# Patient Record
Sex: Male | Born: 1935 | Race: White | Hispanic: No | Smoking: Former smoker
Health system: Southern US, Community
[De-identification: ages and names within clinical notes are randomized; demographics above are authoritative.]

## PROBLEM LIST (undated history)

## (undated) DIAGNOSIS — K769 Liver disease, unspecified: Secondary | ICD-10-CM

## (undated) DIAGNOSIS — K219 Gastro-esophageal reflux disease without esophagitis: Secondary | ICD-10-CM

## (undated) DIAGNOSIS — I10 Essential (primary) hypertension: Secondary | ICD-10-CM

## (undated) DIAGNOSIS — R490 Dysphonia: Secondary | ICD-10-CM

## (undated) DIAGNOSIS — R51 Headache: Secondary | ICD-10-CM

## (undated) DIAGNOSIS — I8290 Acute embolism and thrombosis of unspecified vein: Secondary | ICD-10-CM

## (undated) DIAGNOSIS — I714 Abdominal aortic aneurysm, without rupture: Secondary | ICD-10-CM

## (undated) DIAGNOSIS — N189 Chronic kidney disease, unspecified: Secondary | ICD-10-CM

## (undated) DIAGNOSIS — R0602 Shortness of breath: Secondary | ICD-10-CM

## (undated) DIAGNOSIS — I2510 Atherosclerotic heart disease of native coronary artery without angina pectoris: Secondary | ICD-10-CM

## (undated) DIAGNOSIS — M1990 Unspecified osteoarthritis, unspecified site: Secondary | ICD-10-CM

## (undated) DIAGNOSIS — I219 Acute myocardial infarction, unspecified: Secondary | ICD-10-CM

## (undated) HISTORY — DX: Abdominal aortic aneurysm, without rupture: I71.4

## (undated) HISTORY — PX: CHOLECYSTECTOMY: SHX55

## (undated) HISTORY — DX: Chronic kidney disease, unspecified: N18.9

## (undated) HISTORY — PX: EYE SURGERY: SHX253

## (undated) HISTORY — PX: CORONARY ARTERY BYPASS GRAFT: SHX141

---

## 1999-03-05 ENCOUNTER — Emergency Department (HOSPITAL_COMMUNITY): Admission: EM | Admit: 1999-03-05 | Discharge: 1999-03-05 | Payer: Self-pay | Admitting: Emergency Medicine

## 1999-03-07 ENCOUNTER — Emergency Department (HOSPITAL_COMMUNITY): Admission: EM | Admit: 1999-03-07 | Discharge: 1999-03-07 | Payer: Self-pay | Admitting: Emergency Medicine

## 2008-03-14 ENCOUNTER — Encounter: Admission: RE | Admit: 2008-03-14 | Discharge: 2008-03-14 | Payer: Self-pay | Admitting: Internal Medicine

## 2009-05-01 ENCOUNTER — Encounter: Admission: RE | Admit: 2009-05-01 | Discharge: 2009-05-01 | Payer: Self-pay | Admitting: Internal Medicine

## 2009-08-01 ENCOUNTER — Encounter: Admission: RE | Admit: 2009-08-01 | Discharge: 2009-08-01 | Payer: Self-pay | Admitting: Internal Medicine

## 2011-08-14 ENCOUNTER — Other Ambulatory Visit: Payer: Self-pay | Admitting: Otolaryngology

## 2011-08-14 DIAGNOSIS — R490 Dysphonia: Secondary | ICD-10-CM

## 2011-08-14 DIAGNOSIS — D141 Benign neoplasm of larynx: Secondary | ICD-10-CM

## 2011-08-19 ENCOUNTER — Ambulatory Visit
Admission: RE | Admit: 2011-08-19 | Discharge: 2011-08-19 | Disposition: A | Discharge status: Home / Self Care | Payer: Medicare Other | Source: Ambulatory Visit | Attending: Otolaryngology | Admitting: Otolaryngology

## 2011-08-19 DIAGNOSIS — D141 Benign neoplasm of larynx: Secondary | ICD-10-CM

## 2011-08-19 DIAGNOSIS — R490 Dysphonia: Secondary | ICD-10-CM

## 2011-08-19 MED ORDER — IOHEXOL 300 MG/ML  SOLN
75.0000 mL | Freq: Once | INTRAMUSCULAR | Status: AC | PRN
  Administered 2011-08-19: 75 mL via INTRAVENOUS

## 2011-09-15 ENCOUNTER — Encounter (HOSPITAL_COMMUNITY): Payer: Self-pay | Admitting: Pharmacy Technician

## 2011-09-22 NOTE — Pre-Procedure Instructions (Signed)
20 Tyquez Hollibaugh [facility]  09/22/2011   Your procedure is scheduled on:  Wednesday September 30, 2011  Report to [facility] Short Stay Center at 9:00 AM.  Call this number if you have problems the morning of surgery: [phone]   Remember:   Do not eat food or drink:After Midnight.      Take these medicines the morning of surgery with A SIP OF WATER: bisoprolol, flonase, norco, protonix,    Do not wear jewelry, make-up or nail polish.  Do not wear lotions, powders, or perfumes. You may wear deodorant.  Do not shave 48 hours prior to surgery. Men may shave face and neck.  Do not bring valuables to the hospital.  Contacts, dentures or bridgework may not be worn into surgery.  Leave suitcase in the car. After surgery it may be brought to your room.  For patients admitted to the hospital, checkout time is 11:00 AM the day of discharge.   Patients discharged the day of surgery will not be allowed to drive home.  Name and phone number of your driver: family / friend  Special Instructions: CHG Shower Use Special Wash: 1/2 bottle night before surgery and 1/2 bottle morning of surgery.   Please read over the following fact sheets that you were given: Pain Booklet, Coughing and Deep Breathing, MRSA Information and Surgical Site Infection Prevention

## 2011-09-23 ENCOUNTER — Encounter (HOSPITAL_COMMUNITY)
Admission: RE | Admit: 2011-09-23 | Discharge: 2011-09-23 | Disposition: A | Discharge status: Home / Self Care | Payer: Medicare Other | Source: Ambulatory Visit | Attending: Otolaryngology | Admitting: Otolaryngology

## 2011-09-23 ENCOUNTER — Encounter (HOSPITAL_COMMUNITY): Payer: Self-pay

## 2011-09-23 ENCOUNTER — Ambulatory Visit (HOSPITAL_COMMUNITY)
Admission: RE | Admit: 2011-09-23 | Discharge: 2011-09-23 | Disposition: A | Discharge status: Home / Self Care | Payer: Medicare Other | Source: Ambulatory Visit | Attending: Otolaryngology | Admitting: Otolaryngology

## 2011-09-23 DIAGNOSIS — Z01818 Encounter for other preprocedural examination: Secondary | ICD-10-CM | POA: Insufficient documentation

## 2011-09-23 DIAGNOSIS — I771 Stricture of artery: Secondary | ICD-10-CM | POA: Insufficient documentation

## 2011-09-23 DIAGNOSIS — Z01812 Encounter for preprocedural laboratory examination: Secondary | ICD-10-CM | POA: Insufficient documentation

## 2011-09-23 HISTORY — DX: Gastro-esophageal reflux disease without esophagitis: K21.9

## 2011-09-23 HISTORY — DX: Acute myocardial infarction, unspecified: I21.9

## 2011-09-23 HISTORY — DX: Dysphonia: R49.0

## 2011-09-23 HISTORY — DX: Shortness of breath: R06.02

## 2011-09-23 HISTORY — DX: Unspecified osteoarthritis, unspecified site: M19.90

## 2011-09-23 HISTORY — DX: Essential (primary) hypertension: I10

## 2011-09-23 LAB — CBC
HCT: 36.9 % — ABNORMAL LOW (ref 39.0–52.0)
Hemoglobin: 12.6 g/dL — ABNORMAL LOW (ref 13.0–17.0)
MCH: 30.9 pg (ref 26.0–34.0)
MCHC: 34.1 g/dL (ref 30.0–36.0)
Platelets: 177 10*3/uL (ref 150–400)
RBC: 4.08 MIL/uL — ABNORMAL LOW (ref 4.22–5.81)
WBC: 6.6 10*3/uL (ref 4.0–10.5)

## 2011-09-23 LAB — BASIC METABOLIC PANEL
BUN: 17 mg/dL (ref 6–23)
Calcium: 10 mg/dL (ref 8.4–10.5)
Chloride: 104 mEq/L (ref 96–112)
GFR calc Af Amer: 61 mL/min — ABNORMAL LOW (ref >90)

## 2011-09-23 LAB — SURGICAL PCR SCREEN
MRSA, PCR: NEGATIVE
Staphylococcus aureus: NEGATIVE

## 2011-09-23 NOTE — Progress Notes (Signed)
Pt. Has no recall of where he has had cardiac care  done in recent past but states he use to see Dr. Lucas Mallow at [facility]. For cardiac care until he retired.   Recently he saw another cardiologist & isn't sure where it was.  [facility]. Doesn't know either.

## 2011-09-23 NOTE — Progress Notes (Signed)
Spoke with Amy, at [facility], requested that Dr. Lazarus Salines sign his orders.

## 2011-09-23 NOTE — Progress Notes (Signed)
Left msg. At [facility]. To call [facility] with name of cardiologist that pt. Sees & has seen recently. Pt. Unsure where or who he saw just one month ago, had ekg, visit, etc.

## 2011-09-23 NOTE — H&P (Signed)
Winfield,  Stetson 78 y.o., male [id number]     Chief Complaint: Hoarseness  HPI: Patient is a 78 year old male who presents for hoarseness for 4-5 months. Hoarseness has been persistent and voice has not returned to normal. It gets worse with excessive vocal use. There is no throat pain, odynophagia, dysphagia, cough, oral regurgitation or referred otalgia. He occasionally coughs up white phlegm from the throat. Was treated a couple months ago with Flonase for allergies. Last week Protonix 40mg BID was added for presumed reflux. Medications have not helped. He rarely has indigestion, usually with spicy foods. No recent surgeries requiring intubation and no thyroid problems. Quit smoking in 1990, smoked up to 1.5ppd for about 50 years. Drinks a few glasses of tea per day. No history of recurrent sinusitis or allergies.  One month check for preoperative evaluation.  CT scan showed a RIGHT vocal cord lesion but no other significant disease and no adenopathy.  He received anesthetic clearance from Dr. Mackenzie.      I discussed the propose surgery including microlaryngoscopy with local excision, frozen section, and possible laser wide local excision if the diagnosis is malignant.  Risks and complications, including bleeding, pain, airway compromise, infection, or recurrent malignancy were discussed.  Questions were answered and informed consent was obtained.  I discussed advancement of diet and activity postoperatively.  Lortab liquid prescription for pain and cough control for postoperative use given.  PMH: Past Medical History  Diagnosis Date  . Hypertension   . Shortness of breath   . Diabetes mellitus   . GERD (gastroesophageal reflux disease)   . Myocardial infarction     1990  . Arthritis     R elbow- ? bursitis in R knee, bulging disc- lumbar, congenital fusion of lumbar 5-S1?  . Hoarseness of voice     vocal chord growth    Surg Hx: Past Surgical History  Procedure Date  . Coronary  artery bypass graft     1990  . Cholecystectomy     open procedure   . Eye surgery     blepheroplasty - bilateral    FHx:  No family history on file. SocHx:  reports that he has quit smoking. He quit smokeless tobacco use about 23 years ago. He reports that he does not drink alcohol or use illicit drugs.  ALLERGIES:  Allergies  Allergen Reactions  . Januvia (Sitagliptin) Rash    No prescriptions prior to admission    Results for orders placed during the hospital encounter of 09/23/11 (from the past 48 hour(s))  BASIC METABOLIC PANEL     Status: Abnormal   Collection Time   09/23/11 11:00 AM      Component Value Range Comment   Sodium 138  135 - 145 mEq/L    Potassium 4.2  3.5 - 5.1 mEq/L    Chloride 104  96 - 112 mEq/L    CO2 24  19 - 32 mEq/L    Glucose, Bld 157 (*) 70 - 99 mg/dL    BUN 17  6 - 23 mg/dL    Creatinine, Ser 1.25  0.50 - 1.35 mg/dL    Calcium 10.0  8.4 - 10.5 mg/dL    GFR calc non Af Amer 53 (*) >90 mL/min    GFR calc Af Amer 61 (*) >90 mL/min   CBC     Status: Abnormal   Collection Time   09/23/11 11:00 AM      Component Value Range Comment   WBC   6.6  4.0 - 10.5 K/uL    RBC 4.08 (*) 4.22 - 5.81 MIL/uL    Hemoglobin 12.6 (*) 13.0 - 17.0 g/dL    HCT 36.9 (*) 39.0 - 52.0 %    MCV 90.4  78.0 - 100.0 fL    MCH 30.9  26.0 - 34.0 pg    MCHC 34.1  30.0 - 36.0 g/dL    RDW 13.6  11.5 - 15.5 %    Platelets 177  150 - 400 K/uL    Dg Chest 2 View  09/23/2011  *RADIOLOGY REPORT*  Clinical Data: Preop  CHEST - 2 VIEW  Comparison: None.  Findings: Cardiomediastinal silhouette is unremarkable.  The patient is status post CABG.  Tortuous thoracic aorta.  Mild degenerative changes thoracic spine.  No acute infiltrate or pulmonary edema.  IMPRESSION: Status post CABG.  No active disease.  Original Report Authenticated By: LIVIU POP, M.D.    ROS:Systemic: Not feeling tired (fatigue).  No fever, no night sweats, and no recent weight loss. Head: No headache. Eyes: No eye  symptoms. Otolaryngeal: No hearing loss, no earache, no tinnitus, and no purulent nasal discharge.  No nasal passage blockage, no snoring, no sneezing, no hoarseness, and no sore throat. Cardiovascular: No chest pain or discomfort  and no palpitations. Pulmonary: No dyspnea, no cough, and no wheezing. Gastrointestinal: No dysphagia, no heartburn, no nausea, no abdominal pain, and no melena.  No diarrhea. Genitourinary: No dysuria. Endocrine: No muscle weakness. Musculoskeletal: No calf muscle cramps, no arthralgias, and no soft tissue swelling. Neurological: No dizziness, no fainting, no tingling, and no numbness. Psychological: No anxiety  and no depression. Skin: No rash.  There were no vitals taken for this visit.  PHYSICAL EXAM: APPEARANCE: Well developed, well nourished, in no acute distress.  Normal affect, in a pleasant mood.  Oriented to time, place and person. COMMUNICATION: Normal voice   HEAD & FACE:  No scars, lesions or masses of head and face.  Sinuses nontender to palpation.  Salivary glands without mass or tenderness.  Facial strength symmetric.   EYES: EOMI with normal primary gaze alignment. Visual acuity grossly intact.  PERRLA EXTERNAL EAR & NOSE: No scars, lesions or masses  EAC & TYMPANIC MEMBRANE:  EAC shows no obstructing lesions or debris and tympanic membranes are normal bilaterally. No infection, middle ear effusion or cholesteatoma. GROSS HEARING: Normal   TMJ:  Nontender  INTRANASAL EXAM: No polyps or purulence.  NASOPHARYNX: Normal, without lesions. LIPS, TEETH & GUMS: No lip lesions and normal gums. Dentures upper.  ORAL CAVITY/OROPHARYNX:  Oral mucosa moist without lesion or asymmetry of the palate, tongue, tonsil or posterior pharynx. NECK:  Supple without adenopathy or mass. THYROID:  Normal with no masses palpable.   LARYNX (mirror exam): Could not visualize due to a strong gag reflex.  Lungs: Clear to auscultation Heart: Regular rate and rhythm with  distant heart sounds and no audible murmur Abdomen: Soft and active Extremities: Normal configuration Neurologic: intact and symmetric.  Procedure   Fiberoptic Laryngoscopy Name: Alisha Ertl     Age: 78 year   Performing Provider: Louise Nordbladh. Dr. Starlena Beil present for exam.  The risks of the procedure are minimal and were discussed with the patient today. Pre-op Diagnosis: dysphonia Post-op Diagnosis: subglottic papilloma Allergy:  reviewed allergies as listed Nasal Prep: Afrin/xylocaine   Procedure:With the patient seated in the exam chair, the right nasal cavity was intubated with the flexible laryngoscope.  The nasal cavity mucosa, nasopharynx, hypopharynx and larynx   were all examined with findings as noted.  The scope was then removed.  The patient tolerated the procedure well without complication or blood loss (unless indicated in findings).   FINDINGS: Nasal mucosa: normal Nasopharynx: normal Hypopharynx: normal Larynx: subglottic area on the undersurface of the him right vocal cord is a papillomatous growth, with verrucous like surface. It is just right of midline. Causing incomplete adduction of right vocal cord. True vocal cords appear normal without any lesion.  Airway is good.  Studies Reviewed:  CT neck with contrast    Assessment/Plan . Hoarseness   (784.42) . 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 It has the appearance of a verrucous papilloma however carcinoma cannot be excluded. Recommend micro direct laryngoscopy wtih biopsy and frozen section. Possible laser ablation, possible esophagoscopy and possible bronchoscopy depending on pathology results. We will order a CT of neck with contrast focused on this area prior to surgery. We will see him back in the office to discuss imaging results before surgery. Patient agrees with the plan.  We are going to remove the lesion from your RIGHT vocal cord.  Depending  on the pathology report, you may require additional surgery, or possibly even radiation treatments.    Stop your aspirin for 10 days before the surgery.  You should be able to go home.  You may advance your diet as comfortable.    Hydrocodone liquid for pain control or for cough suppression.  Recheck here my office 2 weeks.  Amor Packard 09/23/2011, 12:59 PM      

## 2011-09-23 NOTE — Pre-Procedure Instructions (Signed)
20 Desmond Szabo [facility]  09/23/2011   Your procedure is scheduled on:  09/30/2011  Report to [facility] at 9:00 AM.  Call this number if you have problems the morning of surgery: [phone]   Remember:   Do not eat food or drink liquids :After Midnight.   TUESDAY    Take these medicines the morning of surgery with A SIP OF WATER: PROTONIX, BISOPROLOL   Do not wear jewelry, make-up or nail polish.  Do not wear lotions, powders, or perfumes. You may wear deodorant.  Do not shave 48 hours prior to surgery. Men may shave face and neck.  Do not bring valuables to the hospital.  Contacts, dentures or bridgework may not be worn into surgery.  Leave suitcase in the car. After surgery it may be brought to your room.  For patients admitted to the hospital, checkout time is 11:00 AM the day of discharge.   Patients discharged the day of surgery will not be allowed to drive home.  Name and phone number of your driver: /w Cruz Condon  Special Instructions: CHG Shower Use Special Wash: 1/2 bottle night before surgery and 1/2 bottle morning of surgery.   Please read over the following fact sheets that you were given: Pain Booklet, Coughing and Deep Breathing, MRSA Information and Surgical Site Infection Prevention

## 2011-09-24 ENCOUNTER — Encounter (HOSPITAL_COMMUNITY): Payer: Self-pay | Admitting: Vascular Surgery

## 2011-09-24 NOTE — Consult Note (Addendum)
Anesthesia chart review: Patient is a 78 year old male posted for micro direct laryngoscopy, biopsy frozen section, possible laser ablation (orders pending) by Dr. Lazarus Salines on 09/30/11 for a right subglottic lesion.  History includes former smoker, HTN, DM2, SOB, CKD Stage III, GERD, AAA (3.1 cm by ultrasound) hoarseness, arthritis, CAD/MI s/p CABG '90, cholecystectomy.  His PCP is Dr. Thayer Headings ([facility]).  By notes, Dr. Lazarus Salines spoke with Dr. Thea Silversmith who felt the patient was "an appropriate candidate for general anesthesia without need for further work-up."    Medications include ASA, Lipitor, bisoprolol, HCTZ, metformin, Protonix, quinapril.    He is not being followed regularly by a Cardiologist.  He previously saw Dr. Lucas Mallow.  His last treadmill stress test was in 2001.  His last echo was done on 05/04/05 that showed LVEF >50%, mildly increased LV internal diameter, borderline low LV contractility (results felt stable since 10/25/03).  He was referred to Dr. Jacinto Halim in March of this year.  EKG on 04/24/11 showed bradycardia @ 54 bpm, lateral ST depression.  This appears overall stable when compared to his EKG on 02/06/11 ([facility]).    Dr. Jacinto Halim actually recommended a Lexiscan Sestamibi stress test, 2D echo, and abdominal aortic duplex; however, the patient declined.  CXR on 09/23/11 showed evidence of CABG, no active disease.    CT scan of the neck on 08/19/11 showed: 1. 5 mm hyperenhancing soft tissue polyp/lesion situated  anteriorly in the larynx on the right. Series 2 image 54 and 55. It appears inseparable from the anterior commisure.  2. No other laryngeal abnormality. No cervical lymphadenopathy or other acute findings.  3. Left ICA origin atherosclerosis.  Abdominal U/S on 08/01/09 showed: 1. Approximate 3.1 cm diameter infrarenal abdominal aortic  aneurysm, unchanged from the prior MRI lumbar spine from February, 2010.  2. Ectatic bilateral common iliac arteries measured above.  Labs noted.   Cr 1.25, glucose 157.  H/H 12.6/36.9.  PLT 177.   I reviewed above with Anesthesiologist Dr. Jacklynn Bue.  He called and spoke with Dr. Lazarus Salines.  At this point, Anesthesia feels patient will need cardiac clearance, additional functional studies, or a more detailed clearance note from Dr. Thea Silversmith clarifying reasons for medical clearance despite patient's decline to undergo cardiac testing as recommended by Dr. Jacinto Halim.   Shonna Chock, PA-C  09/25/11 1442

## 2011-09-25 ENCOUNTER — Encounter (HOSPITAL_COMMUNITY): Payer: Self-pay | Admitting: Vascular Surgery

## 2011-09-30 ENCOUNTER — Encounter (HOSPITAL_COMMUNITY): Admission: RE | Payer: Self-pay | Source: Ambulatory Visit

## 2011-09-30 ENCOUNTER — Ambulatory Visit (HOSPITAL_COMMUNITY): Admission: RE | Admit: 2011-09-30 | Payer: Medicare Other | Source: Ambulatory Visit | Admitting: Otolaryngology

## 2011-09-30 SURGERY — LARYNGOSCOPY, DIRECT
Anesthesia: General

## 2011-10-05 ENCOUNTER — Encounter (HOSPITAL_COMMUNITY): Payer: Self-pay | Admitting: Pharmacy Technician

## 2011-10-05 MED ORDER — DEXTROSE 5 % IV SOLN: 2.0000 g | Freq: Once | INTRAVENOUS | Status: DC

## 2011-10-05 MED ORDER — DEXTROSE 5 % IV SOLN: 2.0000 g | INTRAVENOUS | Status: DC

## 2011-10-05 NOTE — H&P (View-Only) (Signed)
Jason [name],  Adams 78 y.o., male [id number]     Chief Complaint: Hoarseness  HPI: Patient is a 78 year old male who presents for hoarseness for 4-5 months. Hoarseness has been persistent and voice has not returned to normal. It gets worse with excessive vocal use. There is no throat pain, odynophagia, dysphagia, cough, oral regurgitation or referred otalgia. He occasionally coughs up white phlegm from the throat. Was treated a couple months ago with Flonase for allergies. Last week Protonix 40mg  BID was added for presumed reflux. Medications have not helped. He rarely has indigestion, usually with spicy foods. No recent surgeries requiring intubation and no thyroid problems. Quit smoking in 1990, smoked up to 1.5ppd for about 50 years. Drinks a few glasses of tea per day. No history of recurrent sinusitis or allergies.  One month check for preoperative evaluation.  CT scan showed a RIGHT vocal cord lesion but no other significant disease and no adenopathy.  He received anesthetic clearance from Dr. Thea Silversmith.      I discussed the propose surgery including microlaryngoscopy with local excision, frozen section, and possible laser wide local excision if the diagnosis is malignant.  Risks and complications, including bleeding, pain, airway compromise, infection, or recurrent malignancy were discussed.  Questions were answered and informed consent was obtained.  I discussed advancement of diet and activity postoperatively.  Lortab liquid prescription for pain and cough control for postoperative use given.  PMH: Past Medical History  Diagnosis Date  . Hypertension   . Shortness of breath   . Diabetes mellitus   . GERD (gastroesophageal reflux disease)   . Myocardial infarction     1990  . Arthritis     R elbow- ? bursitis in R knee, bulging disc- lumbar, congenital fusion of lumbar 5-S1?  Marland Kitchen Hoarseness of voice     vocal chord growth    Surg Hx: Past Surgical History  Procedure Date  . Coronary  artery bypass graft     1990  . Cholecystectomy     open procedure   . Eye surgery     blepheroplasty - bilateral    FHx:  No family history on file. SocHx:  reports that he has quit smoking. He quit smokeless tobacco use about 23 years ago. He reports that he does not drink alcohol or use illicit drugs.  ALLERGIES:  Allergies  Allergen Reactions  . Januvia (Sitagliptin) Rash    No prescriptions prior to admission    Results for orders placed during the hospital encounter of 09/23/11 (from the past 48 hour(s))  BASIC METABOLIC PANEL     Status: Abnormal   Collection Time   09/23/11 11:00 AM      Component Value Range Comment   Sodium 138  135 - 145 mEq/L    Potassium 4.2  3.5 - 5.1 mEq/L    Chloride 104  96 - 112 mEq/L    CO2 24  19 - 32 mEq/L    Glucose, Bld 157 (*) 70 - 99 mg/dL    BUN 17  6 - 23 mg/dL    Creatinine, Ser 8.46  0.50 - 1.35 mg/dL    Calcium 96.2  8.4 - 10.5 mg/dL    GFR calc non Af Amer 53 (*) >90 mL/min    GFR calc Af Amer 61 (*) >90 mL/min   CBC     Status: Abnormal   Collection Time   09/23/11 11:00 AM      Component Value Range Comment   WBC  6.6  4.0 - 10.5 K/uL    RBC 4.08 (*) 4.22 - 5.81 MIL/uL    Hemoglobin 12.6 (*) 13.0 - 17.0 g/dL    HCT 16.1 (*) 09.6 - 52.0 %    MCV 90.4  78.0 - 100.0 fL    MCH 30.9  26.0 - 34.0 pg    MCHC 34.1  30.0 - 36.0 g/dL    RDW 04.5  40.9 - 81.1 %    Platelets 177  150 - 400 K/uL    Dg Chest 2 View  09/23/2011  *RADIOLOGY REPORT*  Clinical Data: Preop  CHEST - 2 VIEW  Comparison: None.  Findings: Cardiomediastinal silhouette is unremarkable.  The patient is status post CABG.  Tortuous thoracic aorta.  Mild degenerative changes thoracic spine.  No acute infiltrate or pulmonary edema.  IMPRESSION: Status post CABG.  No active disease.  Original Report Authenticated By: Natasha Mead, M.D.    BJY:NWGNFAOZ: Not feeling tired (fatigue).  No fever, no night sweats, and no recent weight loss. Head: No headache. Eyes: No eye  symptoms. Otolaryngeal: No hearing loss, no earache, no tinnitus, and no purulent nasal discharge.  No nasal passage blockage, no snoring, no sneezing, no hoarseness, and no sore throat. Cardiovascular: No chest pain or discomfort  and no palpitations. Pulmonary: No dyspnea, no cough, and no wheezing. Gastrointestinal: No dysphagia, no heartburn, no nausea, no abdominal pain, and no melena.  No diarrhea. Genitourinary: No dysuria. Endocrine: No muscle weakness. Musculoskeletal: No calf muscle cramps, no arthralgias, and no soft tissue swelling. Neurological: No dizziness, no fainting, no tingling, and no numbness. Psychological: No anxiety  and no depression. Skin: No rash.  There were no vitals taken for this visit.  PHYSICAL EXAM: APPEARANCE: Well developed, well nourished, in no acute distress.  Normal affect, in a pleasant mood.  Oriented to time, place and person. COMMUNICATION: Normal voice   HEAD & FACE:  No scars, lesions or masses of head and face.  Sinuses nontender to palpation.  Salivary glands without mass or tenderness.  Facial strength symmetric.   EYES: EOMI with normal primary gaze alignment. Visual acuity grossly intact.  PERRLA EXTERNAL EAR & NOSE: No scars, lesions or masses  EAC & TYMPANIC MEMBRANE:  EAC shows no obstructing lesions or debris and tympanic membranes are normal bilaterally. No infection, middle ear effusion or cholesteatoma. GROSS HEARING: Normal   TMJ:  Nontender  INTRANASAL EXAM: No polyps or purulence.  NASOPHARYNX: Normal, without lesions. LIPS, TEETH & GUMS: No lip lesions and normal gums. Dentures upper.  ORAL CAVITY/OROPHARYNX:  Oral mucosa moist without lesion or asymmetry of the palate, tongue, tonsil or posterior pharynx. NECK:  Supple without adenopathy or mass. THYROID:  Normal with no masses palpable.   LARYNX (mirror exam): Could not visualize due to a strong gag reflex.  Lungs: Clear to auscultation Heart: Regular rate and rhythm with  distant heart sounds and no audible murmur Abdomen: Soft and active Extremities: Normal configuration Neurologic: intact and symmetric.  Procedure   Fiberoptic Laryngoscopy Name: Joshawa Dubin     Age: 67 year   Performing Provider: Aquilla Hacker. Dr. Lazarus Salines present for exam.  The risks of the procedure are minimal and were discussed with the patient today. Pre-op Diagnosis: dysphonia Post-op Diagnosis: subglottic papilloma Allergy:  reviewed allergies as listed Nasal Prep: Afrin/xylocaine   Procedure:With the patient seated in the exam chair, the right nasal cavity was intubated with the flexible laryngoscope.  The nasal cavity mucosa, nasopharynx, hypopharynx and larynx  were all examined with findings as noted.  The scope was then removed.  The patient tolerated the procedure well without complication or blood loss (unless indicated in findings).   FINDINGS: Nasal mucosa: normal Nasopharynx: normal Hypopharynx: normal Larynx: subglottic area on the undersurface of the him right vocal cord is a papillomatous growth, with verrucous like surface. It is just right of midline. Causing incomplete adduction of right vocal cord. True vocal cords appear normal without any lesion.  Airway is good.  Studies Reviewed:  CT neck with contrast    Assessment/Plan . Hoarseness   (784.42) . 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 It has the appearance of a verrucous papilloma however carcinoma cannot be excluded. Recommend micro direct laryngoscopy wtih biopsy and frozen section. Possible laser ablation, possible esophagoscopy and possible bronchoscopy depending on pathology results. We will order a CT of neck with contrast focused on this area prior to surgery. We will see him back in the office to discuss imaging results before surgery. Patient agrees with the plan.  We are going to remove the lesion from your RIGHT vocal cord.  Depending  on the pathology report, you may require additional surgery, or possibly even radiation treatments.    Stop your aspirin for 10 days before the surgery.  You should be able to go home.  You may advance your diet as comfortable.    Hydrocodone liquid for pain control or for cough suppression.  Recheck here my office 2 weeks.  Flo Shanks 09/23/2011, 12:59 PM

## 2011-10-05 NOTE — Interval H&P Note (Signed)
History and Physical Interval Note:  10/05/2011 5:53 PM  Pt. Was on schedule for surgery 2 weeks ago.  Cancelled pending definitive cardiac w/u.  Has seen Dr. Jacinto Halim, Cardiologist, and received nuclear medicine stress testing and echocardiography.  He has been cleared for general anesthetic surgery per Dr. Jacinto Halim.    Jason [name]  has presented today for surgery, with the diagnosis of RIGHT SUBGLOTTIC LESION  The various methods of treatment have been discussed with the patient and family. After consideration of risks, benefits and other options for treatment, the patient has consented to  Procedure(s) (LRB): MICROLARYNGOSCOPY (N/A) ESOPHAGOSCOPY (N/A) FLEXIBLE BRONCHOSCOPY (N/A) as a surgical intervention .  The patient's history has been re-reviewed, patient re-examined, no change in status, stable for surgery.  I have re-reviewed the patient's chart and labs.  Questions were answered to the patient's satisfaction.     Flo Shanks

## 2011-10-06 ENCOUNTER — Encounter (HOSPITAL_COMMUNITY)
Admission: RE | Disposition: A | Discharge status: Home / Self Care | Payer: Self-pay | Source: Ambulatory Visit | Attending: Otolaryngology

## 2011-10-06 ENCOUNTER — Encounter (HOSPITAL_COMMUNITY): Payer: Self-pay | Admitting: Anesthesiology

## 2011-10-06 ENCOUNTER — Ambulatory Visit (HOSPITAL_COMMUNITY)
Admission: RE | Admit: 2011-10-06 | Discharge: 2011-10-06 | Disposition: A | Discharge status: Home / Self Care | Payer: Medicare Other | Source: Ambulatory Visit | Attending: Otolaryngology | Admitting: Otolaryngology

## 2011-10-06 ENCOUNTER — Ambulatory Visit (HOSPITAL_COMMUNITY): Payer: Medicare Other | Admitting: Anesthesiology

## 2011-10-06 ENCOUNTER — Encounter (HOSPITAL_COMMUNITY): Payer: Self-pay | Admitting: Surgery

## 2011-10-06 DIAGNOSIS — I252 Old myocardial infarction: Secondary | ICD-10-CM | POA: Insufficient documentation

## 2011-10-06 DIAGNOSIS — Z951 Presence of aortocoronary bypass graft: Secondary | ICD-10-CM | POA: Insufficient documentation

## 2011-10-06 DIAGNOSIS — I2510 Atherosclerotic heart disease of native coronary artery without angina pectoris: Secondary | ICD-10-CM | POA: Insufficient documentation

## 2011-10-06 DIAGNOSIS — K219 Gastro-esophageal reflux disease without esophagitis: Secondary | ICD-10-CM | POA: Insufficient documentation

## 2011-10-06 DIAGNOSIS — M19029 Primary osteoarthritis, unspecified elbow: Secondary | ICD-10-CM | POA: Insufficient documentation

## 2011-10-06 DIAGNOSIS — D141 Benign neoplasm of larynx: Secondary | ICD-10-CM

## 2011-10-06 DIAGNOSIS — I10 Essential (primary) hypertension: Secondary | ICD-10-CM | POA: Insufficient documentation

## 2011-10-06 DIAGNOSIS — E119 Type 2 diabetes mellitus without complications: Secondary | ICD-10-CM | POA: Insufficient documentation

## 2011-10-06 HISTORY — PX: MICROLARYNGOSCOPY: SHX5208

## 2011-10-06 LAB — GLUCOSE, CAPILLARY: Glucose-Capillary: 127 mg/dL — ABNORMAL HIGH (ref 70–99)

## 2011-10-06 SURGERY — MICROLARYNGOSCOPY
Anesthesia: General | Site: Mouth | Wound class: Clean Contaminated

## 2011-10-06 MED ORDER — SUFENTANIL CITRATE 50 MCG/ML IV SOLN
INTRAVENOUS | Status: DC | PRN
  Administered 2011-10-06: 10 ug via INTRAVENOUS

## 2011-10-06 MED ORDER — OXYCODONE HCL 5 MG/5ML PO SOLN: 5.0000 mg | Freq: Once | ORAL | Status: DC | PRN

## 2011-10-06 MED ORDER — ROCURONIUM BROMIDE 100 MG/10ML IV SOLN
INTRAVENOUS | Status: DC | PRN
  Administered 2011-10-06: 40 mg via INTRAVENOUS
  Administered 2011-10-06: 10 mg via INTRAVENOUS

## 2011-10-06 MED ORDER — COCAINE HCL 4 % EX SOLN
CUTANEOUS | Status: AC
  Filled 2011-10-06: qty 4

## 2011-10-06 MED ORDER — OXYCODONE HCL 5 MG PO TABS: 5.0000 mg | ORAL_TABLET | Freq: Once | ORAL | Status: DC | PRN

## 2011-10-06 MED ORDER — HYDROMORPHONE HCL PF 1 MG/ML IJ SOLN: 0.2500 mg | INTRAMUSCULAR | Status: DC | PRN

## 2011-10-06 MED ORDER — NEOSTIGMINE METHYLSULFATE 1 MG/ML IJ SOLN
INTRAMUSCULAR | Status: DC | PRN
  Administered 2011-10-06: 5 mg via INTRAVENOUS

## 2011-10-06 MED ORDER — LACTATED RINGERS IV SOLN
INTRAVENOUS | Status: DC
  Administered 2011-10-06: 09:00:00 via INTRAVENOUS

## 2011-10-06 MED ORDER — CEFAZOLIN SODIUM-DEXTROSE 2-3 GM-% IV SOLR
INTRAVENOUS | Status: AC
  Filled 2011-10-06: qty 50

## 2011-10-06 MED ORDER — FENTANYL CITRATE 0.05 MG/ML IJ SOLN
50.0000 ug | INTRAMUSCULAR | Status: DC | PRN
  Administered 2011-10-06: 50 ug via INTRAVENOUS

## 2011-10-06 MED ORDER — MIDAZOLAM HCL 2 MG/2ML IJ SOLN
1.0000 mg | INTRAMUSCULAR | Status: DC | PRN
  Administered 2011-10-06: 2 mg via INTRAVENOUS

## 2011-10-06 MED ORDER — LIDOCAINE-EPINEPHRINE 1 %-1:100000 IJ SOLN
INTRAMUSCULAR | Status: AC
  Filled 2011-10-06: qty 1

## 2011-10-06 MED ORDER — ONDANSETRON HCL 4 MG/2ML IJ SOLN
INTRAMUSCULAR | Status: DC | PRN
  Administered 2011-10-06: 4 mg via INTRAVENOUS

## 2011-10-06 MED ORDER — LIDOCAINE-EPINEPHRINE 1 %-1:100000 IJ SOLN
INTRAMUSCULAR | Status: DC | PRN
  Administered 2011-10-06: 4 mL

## 2011-10-06 MED ORDER — 0.9 % SODIUM CHLORIDE (POUR BTL) OPTIME
TOPICAL | Status: DC | PRN
  Administered 2011-10-06: 1000 mL

## 2011-10-06 MED ORDER — METOCLOPRAMIDE HCL 5 MG/ML IJ SOLN: 10.0000 mg | Freq: Once | INTRAMUSCULAR | Status: DC | PRN

## 2011-10-06 MED ORDER — COCAINE HCL 4 % EX SOLN
CUTANEOUS | Status: DC | PRN
  Administered 2011-10-06: 4 mL via TOPICAL

## 2011-10-06 MED ORDER — GLYCOPYRROLATE 0.2 MG/ML IJ SOLN
INTRAMUSCULAR | Status: DC | PRN
  Administered 2011-10-06: .8 mg via INTRAVENOUS

## 2011-10-06 MED ORDER — PROPOFOL 10 MG/ML IV EMUL
INTRAVENOUS | Status: DC | PRN
  Administered 2011-10-06: 300 mg via INTRAVENOUS

## 2011-10-06 MED ORDER — LACTATED RINGERS IV SOLN
INTRAVENOUS | Status: DC | PRN
  Administered 2011-10-06 (×2): via INTRAVENOUS

## 2011-10-06 MED ORDER — LIDOCAINE HCL (CARDIAC) 20 MG/ML IV SOLN
INTRAVENOUS | Status: DC | PRN
  Administered 2011-10-06: 100 mg via INTRAVENOUS

## 2011-10-06 MED ORDER — CEFAZOLIN SODIUM-DEXTROSE 2-3 GM-% IV SOLR
INTRAVENOUS | Status: DC | PRN
  Administered 2011-10-06: 2 g via INTRAVENOUS

## 2011-10-06 SURGICAL SUPPLY — 30 items
CLOTH BEACON ORANGE TIMEOUT ST (SAFETY) ×3 IMPLANT
CONT SPEC 4OZ CLIKSEAL STRL BL (MISCELLANEOUS) ×8 IMPLANT
COVER TABLE BACK 60X90 (DRAPES) ×3 IMPLANT
CRADLE DONUT ADULT HEAD (MISCELLANEOUS) ×2 IMPLANT
DECANTER SPIKE VIAL GLASS SM (MISCELLANEOUS) ×2 IMPLANT
DRAPE PROXIMA HALF (DRAPES) ×3 IMPLANT
ELECT REM PT RETURN 9FT ADLT (ELECTROSURGICAL) ×3
ELECTRODE REM PT RTRN 9FT ADLT (ELECTROSURGICAL) ×1 IMPLANT
GLOVE BIOGEL PI IND STRL 6.5 (GLOVE) ×1 IMPLANT
GLOVE BIOGEL PI INDICATOR 6.5 (GLOVE) ×1
GLOVE ECLIPSE 8.0 STRL XLNG CF (GLOVE) ×3 IMPLANT
GLOVE SURG SS PI 6.5 STRL IVOR (GLOVE) ×4 IMPLANT
GOWN PREVENTION PLUS XLARGE (GOWN DISPOSABLE) ×4 IMPLANT
GUARD TEETH (MISCELLANEOUS) IMPLANT
KIT BASIN OR (CUSTOM PROCEDURE TRAY) ×2 IMPLANT
KIT ROOM TURNOVER OR (KITS) ×3 IMPLANT
MARKER SKIN DUAL TIP RULER LAB (MISCELLANEOUS) IMPLANT
NS IRRIG 1000ML POUR BTL (IV SOLUTION) ×3 IMPLANT
PAD ARMBOARD 7.5X6 YLW CONV (MISCELLANEOUS) ×6 IMPLANT
PAD EYE OVAL STERILE LF (GAUZE/BANDAGES/DRESSINGS) ×4 IMPLANT
PATTIES SURGICAL 1X1 (DISPOSABLE) ×2 IMPLANT
PENCIL BUTTON HOLSTER BLD 10FT (ELECTRODE) ×2 IMPLANT
SOLUTION ANTI FOG 6CC (MISCELLANEOUS) ×2 IMPLANT
SPECIMEN JAR SMALL (MISCELLANEOUS) ×5 IMPLANT
SPONGE GAUZE 4X4 12PLY (GAUZE/BANDAGES/DRESSINGS) ×3 IMPLANT
SYR 5ML LL (SYRINGE) ×2 IMPLANT
TOWEL OR 17X24 6PK STRL BLUE (TOWEL DISPOSABLE) ×6 IMPLANT
TRAP SPECIMEN MUCOUS 40CC (MISCELLANEOUS) IMPLANT
TUBE CONNECTING 12X1/4 (SUCTIONS) ×3 IMPLANT
WATER STERILE IRR 1000ML POUR (IV SOLUTION) ×3 IMPLANT

## 2011-10-06 NOTE — Anesthesia Procedure Notes (Signed)
Procedure Name: Intubation Date/Time: 10/06/2011 10:38 AM Performed by: Glendora Score A Pre-anesthesia Checklist: Patient identified, Emergency Drugs available, Suction available and Patient being monitored Patient Re-evaluated:Patient Re-evaluated prior to inductionOxygen Delivery Method: Circle system utilized Preoxygenation: Pre-oxygenation with 100% oxygen Intubation Type: IV induction Ventilation: Mask ventilation without difficulty and Oral airway inserted - appropriate to patient size Tube type: Reinforced Placement Confirmation: positive ETCO2 and breath sounds checked- equal and bilateral Tube secured with: Tape Dental Injury: Teeth and Oropharynx as per pre-operative assessment  Comments: Patient table turned towards surgeon. IV induction per anesthesia. Surgeon intubated pt with #6 laser ETT

## 2011-10-06 NOTE — Interval H&P Note (Signed)
History and Physical Interval Note:  10/06/2011 9:56 AM  Jason [name]  has presented today for surgery, with the diagnosis of RIGHT SUBGLOTTIC LESIONS  The various methods of treatment have been discussed with the patient and family. After consideration of risks, benefits and other options for treatment, the patient has consented to  Procedure(s) (LRB): MICROLARYNGOSCOPY (N/A) ESOPHAGOSCOPY (N/A) FLEXIBLE BRONCHOSCOPY (N/A) as a surgical intervention .  The patient's history has been re-reviewed, patient re-examined, no change in status, stable for surgery.  I have re-reviewed the patient's chart and labs.  Questions were answered to the patient's satisfaction.    He has a lifelong history of venous varicocities LEFT leg.  He has recently developed tender swelling.  By my evaluation, no deep calf tenderness nor swelling.  Superficial thrombophlebitis behind his LEFT knee.  No deep thigh tenderness.   Flo Shanks

## 2011-10-06 NOTE — Progress Notes (Signed)
Dr. Gelene Mink notified of patient having stress test on Friday and a echocardiogram on Monday however results were not on chart, but Dr. Lazarus Salines made a note last evening stating Dr. Jacinto Halim cleared patient for surgery. Will attempt to call Dr. Verl Dicker office for records.

## 2011-10-06 NOTE — Anesthesia Preprocedure Evaluation (Signed)
Anesthesia Evaluation  Patient identified by MRN, date of birth, ID band Patient awake    Reviewed: Allergy & Precautions, H&P , NPO status , Patient's Chart, lab work & pertinent test results, reviewed documented beta blocker date and time   Airway Mallampati: II TM Distance: >3 FB Neck ROM: full    Dental   Pulmonary shortness of breath and with exertion,  breath sounds clear to auscultation        Cardiovascular hypertension, On Medications and On Home Beta Blockers + CAD, + Past MI and + CABG Rhythm:regular     Neuro/Psych negative neurological ROS  negative psych ROS   GI/Hepatic Neg liver ROS, GERD-  Medicated and Controlled,  Endo/Other  Oral Hypoglycemic Agents  Renal/GU CRFRenal disease  negative genitourinary   Musculoskeletal   Abdominal   Peds  Hematology negative hematology ROS (+)   Anesthesia Other Findings See surgeon's H&P   Reproductive/Obstetrics negative OB ROS                           Anesthesia Physical Anesthesia Plan  ASA: III  Anesthesia Plan: General   Post-op Pain Management:    Induction: Intravenous  Airway Management Planned: Oral ETT  Additional Equipment:   Intra-op Plan:   Post-operative Plan: Extubation in OR  Informed Consent: I have reviewed the patients History and Physical, chart, labs and discussed the procedure including the risks, benefits and alternatives for the proposed anesthesia with the patient or authorized representative who has indicated his/her understanding and acceptance.   Dental Advisory Given  Plan Discussed with: CRNA and Surgeon  Anesthesia Plan Comments:         Anesthesia Quick Evaluation

## 2011-10-06 NOTE — Progress Notes (Signed)
Nurse called Dr. Verl Dicker office and requested that Echocardiogram and stress test results be faxed to short stay section A at [phone]. Office staff stated that the staff who sends faxes would not be in until 0830 but the fax would be sent shortly thereafter.

## 2011-10-06 NOTE — Anesthesia Postprocedure Evaluation (Signed)
  Anesthesia Post-op Note  Patient: Jason [name]  Procedure(s) Performed: Procedure(s) (LRB): MICROLARYNGOSCOPY (N/A)  Patient Location: PACU  Anesthesia Type: General  Level of Consciousness: awake, alert , oriented and patient cooperative  Airway and Oxygen Therapy: Patient Spontanous Breathing and Patient connected to nasal cannula oxygen  Post-op Pain: none  Post-op Assessment: Post-op Vital signs reviewed, Patient's Cardiovascular Status Stable, Respiratory Function Stable, Patent Airway, No signs of Nausea or vomiting and Pain level controlled  Post-op Vital Signs: stable  Complications: No apparent anesthesia complications

## 2011-10-06 NOTE — Preoperative (Signed)
Beta Blockers   Reason not to administer Beta Blockers:Not Applicable 

## 2011-10-06 NOTE — Transfer of Care (Signed)
Immediate Anesthesia Transfer of Care Note  Patient: Jason [name]  Procedure(s) Performed: Procedure(s) (LRB): MICROLARYNGOSCOPY (N/A)  Patient Location: PACU  Anesthesia Type: General  Level of Consciousness: awake, alert  and patient cooperative  Airway & Oxygen Therapy: Patient Spontanous Breathing and Patient connected to face mask oxygen  Post-op Assessment: Report given to PACU RN  Post vital signs: Reviewed and stable  Complications: No apparent anesthesia complications

## 2011-10-06 NOTE — Op Note (Signed)
10/06/2011  11:47 AM    [name], [name]  [id number]   Pre-Op Dx:  Right glottic/subglottic lesion  Post-op Dx: Laryngeal papilloma  Proc: Microlaryngoscopy with laser excision, laryngeal papilloma. Rigid bronchoscopy.   Surg:  Cephus Richer MD  Anes:  General mask converted to general orotracheal  EBL:  Minimal  Comp:  None  Findings:  A soft lobulated lesion filling approximately 50% of the airway just beneath the cords undulat and pedunculated from the inferior surface of the anterior right vocal cord.  Frozen section consistent with squamous papilloma. Negative bronchoscopy.  Procedure: With the patient in a comfortable supine position, intravenous and mask anesthesia was introduced without difficulty.  At an appropriate level, the table was turned 90 degrees away from Anesthesia.  A clean preparation and draping was performed in the standard fashion.  A moist 4 x 4 was used to protect the upper gums.  Using the [facility] laryngoscope, the larynx was visualized.  The rod bronchoscope was used to photograph the lesion. Then bronchoscopy down into the trachea and down into the mainstem bronchus on each side was performed without difficulty. The Jean Rosenthal scope was used to intubate the patient with a 6.5 laser endotracheal tube. The Jean Rosenthal scope was removed.  The laser anterior commissure laryngoscope was introduced taking care to protect lips, teeth, and endotracheal tube.    It was positioned to slightly distend the vocal cords. It was suspended in the standard fashion. 1% Xylocaine with 1 100,000 epinephrine, 4 cc total was infiltrated into the right vocal cord for intraoperative hemostasis. 4% cocaine solution on cotton pledgets was applied beneath and on top of the mass. Several minutes were allowed for this to take effect. The cotton pledgets were removed. Additional photographs were taken.  The microscope was brought into the field. With manipulation with a micro-suction tip and  a cup forceps, the lesion was felt to be pedunculated from the inferior surface of the right vocal cord. 2 biopsies were taken where the lesion met the normal-appearing mucosa of the vocal cord . These were sent for frozen section. Using a large cup forceps, the remainder of the mass was debulked and basically stripped from the inferior surface of the right cord. Additional photographs were taken.   It was elected to ablate the base of the pedicle which would be appropriate therapy with of the frozen section returned as benign or malignant. A moist pledget was placed on top of the endotracheal tube cuff. All personnel in the room had protective eye gear. The laser had been previously tested for aim and function. Saline saturated eye pads were placed on the patient and then the field was wrapped in saline saturated towels.  Beginning at the 4 W, 0.01 seconds setting, single pulse laser was applied. This was much too weak. The settings were gradually increased to 10 W and 0.1 second. The edges of the base where there was slight papillomatous appearance were ablated. The base of the excision site was also  minimally ablated. The anterior commissure was avoided. The free edge and superior surface of the right vocal cord were also avoided. A complete ablation was felt to have been accomplished.   By this time, the frozen section returned as squamous papilloma with no evidence of malignancy. No additional therapy was felt necessary. An additional cocaine pledget was applied in the vocal cords for hemostasis. This was removed and photographs were taken. The laser was removed from the field. All drapes were removed. The laryngoscope was  un-suspended and removed under direct vision.   There was slight bruising of the right upper lip. The gums were intact. .  The neck was palpated on both sides with the findings as described above.  At this point the procedure was completed.   The patient was returned to Anesthesia,  awakened, extubated, and transferred to PACU in satisfactory condition.   Dispo:   PACU to home in the care of family.  Plan:  Analgesia, voice rest, antireflux therapy, recheck my office 3 weeks. We will call him with the pathology reports.  Cephus Richer MD

## 2011-10-07 ENCOUNTER — Encounter (HOSPITAL_COMMUNITY): Payer: Self-pay | Admitting: Otolaryngology

## 2012-02-15 ENCOUNTER — Ambulatory Visit (HOSPITAL_COMMUNITY)
Admission: RE | Admit: 2012-02-15 | Discharge: 2012-02-15 | Disposition: A | Discharge status: Home / Self Care | Payer: Medicare Other | Source: Ambulatory Visit | Attending: Cardiology | Admitting: Cardiology

## 2012-02-15 ENCOUNTER — Encounter (HOSPITAL_COMMUNITY)
Admission: RE | Disposition: A | Discharge status: Home / Self Care | Payer: Self-pay | Source: Ambulatory Visit | Attending: Cardiology

## 2012-02-15 DIAGNOSIS — I25810 Atherosclerosis of coronary artery bypass graft(s) without angina pectoris: Secondary | ICD-10-CM | POA: Insufficient documentation

## 2012-02-15 DIAGNOSIS — I2510 Atherosclerotic heart disease of native coronary artery without angina pectoris: Secondary | ICD-10-CM | POA: Insufficient documentation

## 2012-02-15 HISTORY — PX: LEFT HEART CATHETERIZATION WITH CORONARY ANGIOGRAM: SHX5451

## 2012-02-15 LAB — CBC
MCH: 26.9 pg (ref 26.0–34.0)
MCHC: 31.7 g/dL (ref 30.0–36.0)
MCV: 84.8 fL (ref 78.0–100.0)
Platelets: 201 10*3/uL (ref 150–400)
RDW: 15.3 % (ref 11.5–15.5)
WBC: 5.8 10*3/uL (ref 4.0–10.5)

## 2012-02-15 LAB — GLUCOSE, CAPILLARY: Glucose-Capillary: 161 mg/dL — ABNORMAL HIGH (ref 70–99)

## 2012-02-15 LAB — BASIC METABOLIC PANEL
Calcium: 9.2 mg/dL (ref 8.4–10.5)
Creatinine, Ser: 1.17 mg/dL (ref 0.50–1.35)
GFR calc Af Amer: 66 mL/min — ABNORMAL LOW (ref >90)
GFR calc non Af Amer: 57 mL/min — ABNORMAL LOW (ref >90)

## 2012-02-15 SURGERY — LEFT HEART CATHETERIZATION WITH CORONARY ANGIOGRAM
Anesthesia: LOCAL

## 2012-02-15 MED ORDER — HEPARIN (PORCINE) IN NACL 2-0.9 UNIT/ML-% IJ SOLN
INTRAMUSCULAR | Status: AC
  Filled 2012-02-15: qty 1000

## 2012-02-15 MED ORDER — HYDROMORPHONE HCL PF 2 MG/ML IJ SOLN
INTRAMUSCULAR | Status: AC
  Filled 2012-02-15: qty 1

## 2012-02-15 MED ORDER — SODIUM CHLORIDE 0.9 % IV SOLN: 250.0000 mL | INTRAVENOUS | Status: DC | PRN

## 2012-02-15 MED ORDER — ASPIRIN 81 MG PO CHEW: 324.0000 mg | CHEWABLE_TABLET | ORAL | Status: DC

## 2012-02-15 MED ORDER — SODIUM CHLORIDE 0.9 % IV SOLN
INTRAVENOUS | Status: DC
  Administered 2012-02-15: 06:00:00 via INTRAVENOUS

## 2012-02-15 MED ORDER — MIDAZOLAM HCL 2 MG/2ML IJ SOLN
INTRAMUSCULAR | Status: AC
  Filled 2012-02-15: qty 2

## 2012-02-15 MED ORDER — SODIUM CHLORIDE 0.9 % IV SOLN: 1.0000 mL/kg/h | INTRAVENOUS | Status: DC

## 2012-02-15 MED ORDER — SODIUM CHLORIDE 0.9 % IJ SOLN: 3.0000 mL | INTRAMUSCULAR | Status: DC | PRN

## 2012-02-15 MED ORDER — METFORMIN HCL 1000 MG PO TABS: 1000.0000 mg | ORAL_TABLET | Freq: Two times a day (BID) | ORAL | Status: AC

## 2012-02-15 MED ORDER — SODIUM CHLORIDE 0.9 % IJ SOLN: 3.0000 mL | Freq: Two times a day (BID) | INTRAMUSCULAR | Status: DC

## 2012-02-15 MED ORDER — ACETAMINOPHEN 325 MG PO TABS: 650.0000 mg | ORAL_TABLET | ORAL | Status: DC | PRN

## 2012-02-15 MED ORDER — NITROGLYCERIN 0.2 MG/ML ON CALL CATH LAB
INTRAVENOUS | Status: AC
  Filled 2012-02-15: qty 1

## 2012-02-15 MED ORDER — LIDOCAINE HCL (PF) 1 % IJ SOLN
INTRAMUSCULAR | Status: AC
  Filled 2012-02-15: qty 30

## 2012-02-15 MED ORDER — ONDANSETRON HCL 4 MG/2ML IJ SOLN: 4.0000 mg | Freq: Four times a day (QID) | INTRAMUSCULAR | Status: DC | PRN

## 2012-02-15 NOTE — Interval H&P Note (Deleted)
History and Physical Interval Note:  02/15/2012 6:23 AM  Jason [name]  has presented today for surgery, with the diagnosis of Chest pain  The various methods of treatment have been discussed with the patient and family. After consideration of risks, benefits and other options for treatment, the patient has consented to  Procedure(s) (LRB) with comments: LEFT HEART CATHETERIZATION WITH CORONARY ANGIOGRAM (N/A) as a surgical intervention .  The patient's history has been reviewed, patient examined, no change in status, stable for surgery.  I have reviewed the patient's chart and labs.  Questions were answered to the patient's satisfaction.     [name]

## 2012-02-15 NOTE — CV Procedure (Signed)
Procedures performed: Femoral access Left heart catheterization including left ventriculography, selective right and left coronary arteriography. Saphenous graft and LIMA angiography. Right femoral arteriogram and closure of the right femoral arterial access with Minx.  Indication: Chest pain , dyspnea, CAD S/P CABG. hyperlipidemia. :   HEMODYNAMIC DATA: The left ventricle pressure was 137/11 with  EDP23. Aortic pressure was  131/65 with a mean of 91 mmHg. There was no pressure gradient across the aortic valve   Right coronary proximal segment. Conus branches give collaterals to the distal RCA. The distal RCA is also filled by contralateral collaterals from the LAD. It is a large caliber vessel. Collaterals from the RCA up to the midsegment. It is dominant.   SVG to RCA: Occluded  Left main coronary artery: Left main coronary artery is a large caliber  vessel, with moderate calcification. It immediately bifurcates into LAD and circumflex coronary artery.   Circumflex: Circumflex is a moderate caliber vessel, giving origin to a large but severely diffusely diseased obtuse marginal 1. The OM1 is occluded in the midsegment and the circumflex is occluded in the midsegment. Faint ipsilateral collaterals are evident.  SVG to OM1: Occluded   Ramus intermediate: NA.   LAD: LAD is a large caliber vessel, giving origin to several small diagonals. Proximal LAD has a long 70% hazy stenosis followed by a high-grade 80% stenosis at the origin of the septal perforator 1, followed by post stenotic dilatation and ectasia. Occluded in the midsegment.  LIMA to LAD: Widely patent. The LAD retrogradely fills the right coronary artery.   Left ventriculogram: Left ventriculography revealed ejection fraction of 40% to 45% with inferior, inferoapical akinesis and mild lateral hypokinesis. No significant mitral regurgitation. However LV gram not adequate to evaluate mitral regurgitation.  IMPRESSIONS:  1. severe  native vessel coronary artery disease with occlusion of all the native vessels. Occluded saphenous vein graft to the right coronary artery and circumflex coronary artery. LIMA to LAD is the only vessel and and retrograde lisinopril artery-right coronary artery up to the midsegment. Left systolic function mild to moderately depressed at around 40-45%.  Recommendation: Patient will need Ranexa for chest pain and shortness of breath. There is no option for percutaneous or surgical device position. Fortunately his left systolic function is relatively preserved.  TECHNICAL PROCEDURE: Under sterile precautions using a 6-French right femoral access  A  [place] multipurpose B2 catheter was advanced via right femoral arterial access. The catheter was advanced into the left ventricle and left ventriculography was performed in the LAO/RAO projection.  The catheter was then pulled into the ascending aorta and selective right and saphenous graft angiography was performed. The catheter then pulled out of the body. A 6 French Judkins left 4 diagnostic cath was utilized to engage left main coronary artery.  Left coronary arteriogram was performed. The cath was then pulled out of the body over a J-wire and a 5 [place] LIMA catheter was advanced into the left subdural artery and select left intramammary arteriography was performed. The catheter then pulled out of the body over J-wire. Right femoral arteriogram was performed and arterial access was closed with Minx device with excellent hemostasis. Patient of the fistula. No immediate competitions.

## 2012-02-15 NOTE — Interval H&P Note (Signed)
History and Physical Interval Note:  02/15/2012 6:27 AM  Jason [name]  has presented today for surgery, with the diagnosis of Chest pain  The various methods of treatment have been discussed with the patient and family. After consideration of risks, benefits and other options for treatment, the patient has consented to  Procedure(s) (LRB) with comments: LEFT HEART CATHETERIZATION WITH CORONARY ANGIOGRAM (N/A) as a surgical intervention .  The patient's history has been reviewed, patient examined, no change in status, stable for surgery.  I have reviewed the patient's chart and labs.  Questions were answered to the patient's satisfaction.     [name]

## 2012-02-15 NOTE — Interval H&P Note (Signed)
History and Physical Interval Note:  02/15/2012 7:45 AM  Jason [name]  has presented today for surgery, with the diagnosis of Chest pain  The various methods of treatment have been discussed with the patient and family. After consideration of risks, benefits and other options for treatment, the patient has consented to  Procedure(s) (LRB) with comments: LEFT HEART CATHETERIZATION WITH CORONARY ANGIOGRAM (N/A) and possible angioplasty as a surgical intervention .  The patient's history has been reviewed, patient examined, no change in status, stable for surgery.  I have reviewed the patient's chart and labs. Decreased Hb noted from 09/2011.  Questions were answered to the patient's satisfaction.     [name]

## 2012-02-15 NOTE — H&P (Signed)
Please see paper chart. Patient with known CAD presenting with exertional dyspnea and chest pain for LCP and possible angioplasty.

## 2012-02-15 NOTE — H&P (Deleted)
  Please see paper chart Patient had peripheral arteriogram revealing bilateral iliac stenosis and complex anatomy. Planned PTA bilateral iliacs.

## 2012-03-16 NOTE — Pre-Procedure Instructions (Signed)
[facility]  03/16/2012   Your procedure is scheduled on: Friday  03/18/12  Report to [facility] at 800 AM.  Call this number if you have problems the morning of surgery: [phone]   Remember:   Do not eat food or drink liquids after midnight.   Take these medicines the morning of surgery with A SIP OF WATER:  AMLODIPINE(NORVASC), BISOPROLOL(ZEBETA), PAIN PILL IF NEEDED, IMDUR(ISOSORBIDE MONONITRATE), PANTOPRAZOLE   Do not wear jewelry, make-up or nail polish.  Do not wear lotions, powders, or perfumes. You may wear deodorant.  Do not shave 48 hours prior to surgery. Men may shave face and neck.  Do not bring valuables to the hospital.  Contacts, dentures or bridgework may not be worn into surgery.  Leave suitcase in the car. After surgery it may be brought to your room.  For patients admitted to the hospital, checkout time is 11:00 AM the day of  discharge.   Patients discharged the day of surgery will not be allowed to drive  home.  Name and phone number of your driver:  Special Instructions: Shower using CHG 2 nights before surgery and the night before surgery.  If you shower the day of surgery use CHG.  Use special wash - you have one bottle of CHG for all showers.  You should use approximately 1/3 of the bottle for each shower.   Please read over the following fact sheets that you were given: Pain Booklet, Coughing and Deep Breathing, MRSA Information and Surgical Site Infection Prevention

## 2012-03-17 ENCOUNTER — Encounter (HOSPITAL_COMMUNITY)
Admission: RE | Admit: 2012-03-17 | Discharge: 2012-03-17 | Disposition: A | Discharge status: Home / Self Care | Payer: Medicare Other | Source: Ambulatory Visit | Attending: Otolaryngology | Admitting: Otolaryngology

## 2012-03-17 ENCOUNTER — Other Ambulatory Visit: Payer: Self-pay | Admitting: Otolaryngology

## 2012-03-17 ENCOUNTER — Encounter (HOSPITAL_COMMUNITY): Payer: Self-pay

## 2012-03-17 HISTORY — DX: Atherosclerotic heart disease of native coronary artery without angina pectoris: I25.10

## 2012-03-17 HISTORY — DX: Headache: R51

## 2012-03-17 HISTORY — DX: Liver disease, unspecified: K76.9

## 2012-03-17 HISTORY — DX: Acute embolism and thrombosis of unspecified vein: I82.90

## 2012-03-17 LAB — BASIC METABOLIC PANEL
CO2: 26 mEq/L (ref 19–32)
Calcium: 9.6 mg/dL (ref 8.4–10.5)
GFR calc non Af Amer: 57 mL/min — ABNORMAL LOW (ref >90)
Glucose, Bld: 171 mg/dL — ABNORMAL HIGH (ref 70–99)
Potassium: 3.9 mEq/L (ref 3.5–5.1)
Sodium: 139 mEq/L (ref 135–145)

## 2012-03-17 LAB — CBC
Hemoglobin: 10.9 g/dL — ABNORMAL LOW (ref 13.0–17.0)
Platelets: 175 10*3/uL (ref 150–400)
RBC: 3.94 MIL/uL — ABNORMAL LOW (ref 4.22–5.81)
WBC: 6.8 10*3/uL (ref 4.0–10.5)

## 2012-03-17 NOTE — H&P (Signed)
Jason [name], [place] 79 y.o., male [id number]     Chief Complaint: Laryngeal Papilloma with obstruction  HPI: 3 month postoperative check.  His voice was better for a couple of weeks, and then has been getting worse ever since.  No pain.  No breathing difficulty.  No aspiration.  He has occasional globus sensation and brings up a hunk of gelatinous mucus periodically.  He does not think he is having reflux.  The pathology from his surgery showed a papilloma.   He relates that after having the stress test as part of his preoperative evaluation last time, he developed pain and tenderness in a known varicose vein going across his LEFT medial posterior knee.  He was placed on anticoagulants and support hose.  This is feeling better.  I did speak with Dr.Ganji, his cardiologist, who reports there was no true deep vein thrombosis.    3 weeks recheck.  His throat feels basically unchanged.  Voice may be slightly more horse.  Swallowing without difficulty.  He occasionally will cough up a small piece of debris and a streak of blood.   He is back early because he is having tightness and pressure in his chest more than just breathing difficulty.  It reminds him of before he had his bypass surgery 20+ years ago.  2 days ago, he was unable to sleep lying down.  No obvious wheezing.  No recent upper respiratory infection.  I reviewed his cardiac evaluation per Dr. Jacinto Halim.  His heart condition is stable although tenuous.  I discussed his case with Dr. Ivin Booty.  He feels this patient would be better to either not have the surgery at all, or to have it done at [facility] if necessary.  I discussed this with the patient's partner Burna Mortimer today.  At 1st, he was considering canceling the operation altogether.  However, for the past few days, he is basically aphonic, and now starting to have some choking episodes where breathing is difficult.  He recognizes that there may be some  cardiac risk, although he tolerated this  very same operation in August without difficulty.  We will proceed in the immediate future.  I will complete his preop evaluation in the hospital.  PMH: Past Medical History  Diagnosis Date  . Hypertension   . Shortness of breath   . Diabetes mellitus   . GERD (gastroesophageal reflux disease)   . Myocardial infarction     1990  . Arthritis     R elbow- ? bursitis in R knee, bulging disc- lumbar, congenital fusion of lumbar 5-S1?  Marland Kitchen Hoarseness of voice     vocal chord growth  . Chronic kidney disease     CKD Stage III, as of 08/06/11  . Coronary artery disease   . Headache     pt. reports that its related to BP med., MD is aware   . Liver disease     pt. unsure but states he has seen this on a prior med. record   . AAA (abdominal aortic aneurysm)     3.1 cm, infrarenal by U/S 08/01/09  . Blood clot in vein     pt. reports that he has / had blood clot in L leg - 01/2012    Surg Hx: Past Surgical History  Procedure Date  . Coronary artery bypass graft     1990  . Cholecystectomy     open procedure   . Microlaryngoscopy 10/06/2011    Procedure: MICROLARYNGOSCOPY;  Surgeon: Zola Button  Lazarus Salines, MD;  Location: [facility] OR;  Service: ENT;  Laterality: N/A;  MICRO DIRECT LARYNGOSCOPY BIOPSY, FROZEN SECTION, with LASER ABLATION  . Eye surgery     blepheroplasty - bilateral- 2012, has also had tear duct  probing    FHx:  No family history on file. SocHx:  reports that he has quit smoking. He quit smokeless tobacco use about 23 years ago. He reports that he does not drink alcohol or use illicit drugs.  ALLERGIES:  Allergies  Allergen Reactions  . Januvia (Sitagliptin) Rash     (Not in a hospital admission)  Results for orders placed during the hospital encounter of 03/17/12 (from the past 48 hour(s))  BASIC METABOLIC PANEL     Status: Abnormal   Collection Time   03/17/12  9:21 AM      Component Value Range Comment   Sodium 139  135 - 145 mEq/L    Potassium 3.9  3.5 - 5.1 mEq/L     Chloride 103  96 - 112 mEq/L    CO2 26  19 - 32 mEq/L    Glucose, Bld 171 (*) 70 - 99 mg/dL    BUN 11  6 - 23 mg/dL    Creatinine, Ser 1.61  0.50 - 1.35 mg/dL    Calcium 9.6  8.4 - 09.6 mg/dL    GFR calc non Af Amer 57 (*) >90 mL/min    GFR calc Af Amer 66 (*) >90 mL/min   CBC     Status: Abnormal   Collection Time   03/17/12  9:21 AM      Component Value Range Comment   WBC 6.8  4.0 - 10.5 K/uL    RBC 3.94 (*) 4.22 - 5.81 MIL/uL    Hemoglobin 10.9 (*) 13.0 - 17.0 g/dL    HCT 04.5 (*) 40.9 - 52.0 %    MCV 84.5  78.0 - 100.0 fL    MCH 27.7  26.0 - 34.0 pg    MCHC 32.7  30.0 - 36.0 g/dL    RDW 81.1 (*) 91.4 - 15.5 %    Platelets 175  150 - 400 K/uL    No results found.   There were no vitals taken for this visit.  PHYSICAL EXAM: He is tall and trim but appears slightly anxious.  The voice is a loud whisper but basically non phonatory.  Anterior nose is clear.  Oral cavity is clear.  Neck without adenopathy.   Lungs are clear to auscultation.   Using the flexible laryngoscope, there remains a  8 mm pedunculated apparent papilloma on the undersurface of the anterior RIGHT true vocal cord.  The airway is overall quite good.  Vocal cords are fully mobile.  No pooling in valleculae or pyriforms.    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  We have cleared him for anesthesia with his cardiologist, Dr. Jacinto Halim.  We will proceed with removal, either with cold technique or with the laser.  He may be OK for same day discharge if OK per Anesthesia.  He will need a prescription for liquid Vicodin for pain relief and also cough suppression.    Flo Shanks 03/17/2012, 6:46 PM

## 2012-03-17 NOTE — Progress Notes (Signed)
A. Zelenak,PAC, handed paper copy of pt. Heartcath. result fr. 02/2012

## 2012-03-17 NOTE — Consult Note (Signed)
Anesthesia Chart Review:  See my note from 09/25/11.  Since then patient was evaluated by Dr. Jacinto Halim and has had a stress, echo, and cath.    Cardiac cath on 02/15/12 showed severe native CAD with occlusion of all the native vessels.  Occluded SVG to the RCA and CX.  LIMA to LAD is widely patent.  The LAD retrogradely fills the RCA.  EF 40-45% with inferior, inferoapical akinesis and mild lateral hypokinesis.  Dr. Jacinto Halim felt there were no options for percutaneous intervention.    Lexiscan stress test from 10/03/11 showed inferior, inferiolateral scar with minimal ischemia, EF 42%, low risk stress.  Echo on 10/05/11 showed poor echo window, mild concentric LVH, normal diastolic filling.  Normal systolic global function.  Calculated EF 68%, visual EF 45-50%.  Views are off axis.  Basal inferior wall appears to have mild aneurysmal dilation.  Basal inferior akinesis.  Cannot completely exclude other wall motion abnormalities.  LA is mild to moderately dilated. EKG from 02/08/12 (Dr. Jacinto Halim) showed SR, inferolateral ST/T wave abnormality, consider ischemia.  Dr. Verl Dicker office note from 02/25/12 stated, "From a cardiac standpoint he can be taken up for a coming vocal cord surgery with low risk for cardiovascular morbidity and mortality.  He has excellent collaterals and his left ventricle systolic function is fairly well preserved."  Preoperative labs noted.  If no acute changes then would anticipate he could proceed as planned.   Shonna Chock, PA-C 03/17/12 1535

## 2012-03-17 NOTE — Progress Notes (Addendum)
PCP- Dr. Thea Silversmith. Call to Dr. Lazarus Salines 's office3 times, line is busy.

## 2012-03-17 NOTE — Progress Notes (Addendum)
P. Reports asa will be stopped for a total of 5 days prior to surgery. Pt. Reports after the heart cath. Dr. Jacinto Halim stopped Xarelto, has not taken since. Pt. Reports that he takes Goody's Powder on occas. Metformin has been stopped, Since 03/14/2012 per pt.  Understanding but he was instructed to take today but not tomorrow. Call to Dr. Jacinto Halim, to request last ekg, stress test & last ov note.

## 2012-03-18 ENCOUNTER — Encounter (HOSPITAL_COMMUNITY): Payer: Self-pay | Admitting: Anesthesiology

## 2012-03-18 ENCOUNTER — Encounter (HOSPITAL_COMMUNITY)
Admission: RE | Disposition: A | Discharge status: Home / Self Care | Payer: Self-pay | Source: Ambulatory Visit | Attending: Otolaryngology

## 2012-03-18 ENCOUNTER — Encounter (HOSPITAL_COMMUNITY): Payer: Self-pay | Admitting: Vascular Surgery

## 2012-03-18 ENCOUNTER — Ambulatory Visit (HOSPITAL_COMMUNITY)
Admission: RE | Admit: 2012-03-18 | Discharge: 2012-03-18 | Disposition: A | Discharge status: Home / Self Care | Payer: Medicare Other | Source: Ambulatory Visit | Attending: Otolaryngology | Admitting: Otolaryngology

## 2012-03-18 ENCOUNTER — Ambulatory Visit (HOSPITAL_COMMUNITY): Payer: Medicare Other | Admitting: Vascular Surgery

## 2012-03-18 DIAGNOSIS — N183 Chronic kidney disease, stage 3 unspecified: Secondary | ICD-10-CM | POA: Insufficient documentation

## 2012-03-18 DIAGNOSIS — I129 Hypertensive chronic kidney disease with stage 1 through stage 4 chronic kidney disease, or unspecified chronic kidney disease: Secondary | ICD-10-CM | POA: Insufficient documentation

## 2012-03-18 DIAGNOSIS — E119 Type 2 diabetes mellitus without complications: Secondary | ICD-10-CM | POA: Insufficient documentation

## 2012-03-18 DIAGNOSIS — C320 Malignant neoplasm of glottis: Secondary | ICD-10-CM | POA: Insufficient documentation

## 2012-03-18 HISTORY — PX: MICROLARYNGOSCOPY: SHX5208

## 2012-03-18 SURGERY — MICROLARYNGOSCOPY
Anesthesia: General | Site: Mouth | Wound class: Clean Contaminated

## 2012-03-18 MED ORDER — GLYCOPYRROLATE 0.2 MG/ML IJ SOLN
INTRAMUSCULAR | Status: DC | PRN
  Administered 2012-03-18: .8 mg via INTRAVENOUS

## 2012-03-18 MED ORDER — COCAINE HCL 4 % EX SOLN
CUTANEOUS | Status: DC | PRN
  Administered 2012-03-18: 4 mL via NASAL

## 2012-03-18 MED ORDER — ONDANSETRON HCL 4 MG/2ML IJ SOLN
4.0000 mg | INTRAMUSCULAR | Status: DC | PRN
  Filled 2012-03-18: qty 2

## 2012-03-18 MED ORDER — PROMETHAZINE HCL 25 MG/ML IJ SOLN: 6.2500 mg | INTRAMUSCULAR | Status: DC | PRN

## 2012-03-18 MED ORDER — PROPOFOL 10 MG/ML IV BOLUS
INTRAVENOUS | Status: DC | PRN
  Administered 2012-03-18: 120 mg via INTRAVENOUS

## 2012-03-18 MED ORDER — ARTIFICIAL TEARS OP OINT
TOPICAL_OINTMENT | OPHTHALMIC | Status: DC | PRN
  Administered 2012-03-18: 1 via OPHTHALMIC

## 2012-03-18 MED ORDER — ONDANSETRON HCL 4 MG PO TABS
4.0000 mg | ORAL_TABLET | ORAL | Status: DC | PRN
  Filled 2012-03-18: qty 1

## 2012-03-18 MED ORDER — MEPERIDINE HCL 25 MG/ML IJ SOLN: 6.2500 mg | INTRAMUSCULAR | Status: DC | PRN

## 2012-03-18 MED ORDER — COCAINE HCL 4 % EX SOLN
CUTANEOUS | Status: AC
  Filled 2012-03-18: qty 4

## 2012-03-18 MED ORDER — LIDOCAINE HCL (CARDIAC) 20 MG/ML IV SOLN
INTRAVENOUS | Status: DC | PRN
  Administered 2012-03-18: 70 mg via INTRAVENOUS

## 2012-03-18 MED ORDER — OXYCODONE HCL 5 MG PO TABS: 5.0000 mg | ORAL_TABLET | Freq: Once | ORAL | Status: DC | PRN

## 2012-03-18 MED ORDER — DEXAMETHASONE SODIUM PHOSPHATE 10 MG/ML IJ SOLN
6.0000 mg | Freq: Once | INTRAMUSCULAR | Status: DC
  Filled 2012-03-18: qty 1

## 2012-03-18 MED ORDER — FENTANYL CITRATE 0.05 MG/ML IJ SOLN
INTRAMUSCULAR | Status: DC | PRN
  Administered 2012-03-18 (×2): 50 ug via INTRAVENOUS

## 2012-03-18 MED ORDER — ROCURONIUM BROMIDE 100 MG/10ML IV SOLN
INTRAVENOUS | Status: DC | PRN
  Administered 2012-03-18: 10 mg via INTRAVENOUS
  Administered 2012-03-18: 30 mg via INTRAVENOUS
  Administered 2012-03-18: 5 mg via INTRAVENOUS

## 2012-03-18 MED ORDER — ACETAMINOPHEN 10 MG/ML IV SOLN: 1000.0000 mg | Freq: Once | INTRAVENOUS | Status: DC | PRN

## 2012-03-18 MED ORDER — NEOSTIGMINE METHYLSULFATE 1 MG/ML IJ SOLN
INTRAMUSCULAR | Status: DC | PRN
  Administered 2012-03-18: 5 mg via INTRAVENOUS

## 2012-03-18 MED ORDER — ONDANSETRON HCL 4 MG/2ML IJ SOLN
INTRAMUSCULAR | Status: DC | PRN
  Administered 2012-03-18: 4 mg via INTRAVENOUS

## 2012-03-18 MED ORDER — HYDROCODONE-ACETAMINOPHEN 7.5-325 MG/15ML PO SOLN: 10.0000 mL | ORAL | Status: DC | PRN

## 2012-03-18 MED ORDER — HYDROMORPHONE HCL PF 1 MG/ML IJ SOLN: 0.2500 mg | INTRAMUSCULAR | Status: DC | PRN

## 2012-03-18 MED ORDER — OXYCODONE HCL 5 MG/5ML PO SOLN: 5.0000 mg | Freq: Once | ORAL | Status: DC | PRN

## 2012-03-18 MED ORDER — PROPOFOL INFUSION 10 MG/ML OPTIME
INTRAVENOUS | Status: DC | PRN
  Administered 2012-03-18: 25 ug/kg/min via INTRAVENOUS

## 2012-03-18 MED ORDER — LACTATED RINGERS IV SOLN
INTRAVENOUS | Status: DC | PRN
  Administered 2012-03-18 (×2): via INTRAVENOUS

## 2012-03-18 MED ORDER — LACTATED RINGERS IV SOLN
INTRAVENOUS | Status: DC
  Administered 2012-03-18: 10:00:00 via INTRAVENOUS

## 2012-03-18 MED ORDER — DEXTROSE-NACL 5-0.45 % IV SOLN: INTRAVENOUS | Status: DC

## 2012-03-18 MED ORDER — 0.9 % SODIUM CHLORIDE (POUR BTL) OPTIME
TOPICAL | Status: DC | PRN
  Administered 2012-03-18: 1000 mL

## 2012-03-18 SURGICAL SUPPLY — 22 items
CLOTH BEACON ORANGE TIMEOUT ST (SAFETY) ×2 IMPLANT
CONT SPEC 4OZ CLIKSEAL STRL BL (MISCELLANEOUS) ×4 IMPLANT
CONT SPEC STER OR (MISCELLANEOUS) IMPLANT
COVER MAYO STAND STRL (DRAPES) ×2 IMPLANT
COVER TABLE BACK 60X90 (DRAPES) ×2 IMPLANT
CRADLE DONUT ADULT HEAD (MISCELLANEOUS) IMPLANT
DRAPE PROXIMA HALF (DRAPES) ×2 IMPLANT
GLOVE ECLIPSE 8.0 STRL XLNG CF (GLOVE) ×2 IMPLANT
GUARD TEETH (MISCELLANEOUS) IMPLANT
KIT BASIN OR (CUSTOM PROCEDURE TRAY) ×2 IMPLANT
KIT ROOM TURNOVER OR (KITS) ×2 IMPLANT
MARKER SKIN DUAL TIP RULER LAB (MISCELLANEOUS) IMPLANT
NS IRRIG 1000ML POUR BTL (IV SOLUTION) ×2 IMPLANT
PAD ARMBOARD 7.5X6 YLW CONV (MISCELLANEOUS) ×4 IMPLANT
PATTIES SURGICAL 1X1 (DISPOSABLE) ×1 IMPLANT
SPONGE GAUZE 4X4 12PLY (GAUZE/BANDAGES/DRESSINGS) ×2 IMPLANT
SYR 5ML LL (SYRINGE) IMPLANT
SYR CONTROL 10ML LL (SYRINGE) ×1 IMPLANT
TOWEL OR 17X24 6PK STRL BLUE (TOWEL DISPOSABLE) ×4 IMPLANT
TRAP SPECIMEN MUCOUS 40CC (MISCELLANEOUS) IMPLANT
TUBE CONNECTING 12X1/4 (SUCTIONS) ×2 IMPLANT
WATER STERILE IRR 1000ML POUR (IV SOLUTION) ×2 IMPLANT

## 2012-03-18 NOTE — Anesthesia Preprocedure Evaluation (Addendum)
Anesthesia Evaluation  Patient identified by MRN, date of birth, ID band Patient awake    Reviewed: Allergy & Precautions, H&P , NPO status , Patient's Chart, lab work & pertinent test results, reviewed documented beta blocker date and time   Airway Mallampati: II TM Distance: >3 FB Neck ROM: full    Dental  (+) Dental Advisory Given, Edentulous Upper and Edentulous Lower   Pulmonary shortness of breath and with exertion,  breath sounds clear to auscultation        Cardiovascular hypertension, Pt. on medications and Pt. on home beta blockers + CAD, + Past MI, + CABG and + Peripheral Vascular Disease Rhythm:regular Rate:Normal     Neuro/Psych  Headaches, negative psych ROS   GI/Hepatic Neg liver ROS, GERD-  Medicated and Controlled,  Endo/Other  diabetes, Type 2, Oral Hypoglycemic Agents  Renal/GU CRFRenal disease     Musculoskeletal   Abdominal (+) + obese,   Peds  Hematology negative hematology ROS (+)   Anesthesia Other Findings See surgeon's H&P   Reproductive/Obstetrics                          Anesthesia Physical  Anesthesia Plan  ASA: III  Anesthesia Plan: General   Post-op Pain Management:    Induction: Intravenous  Airway Management Planned: Oral ETT  Additional Equipment:   Intra-op Plan:   Post-operative Plan: Extubation in OR  Informed Consent: I have reviewed the patients History and Physical, chart, labs and discussed the procedure including the risks, benefits and alternatives for the proposed anesthesia with the patient or authorized representative who has indicated his/her understanding and acceptance.   Dental Advisory Given and Dental advisory given  Plan Discussed with: CRNA  Anesthesia Plan Comments:         Anesthesia Quick Evaluation

## 2012-03-18 NOTE — Transfer of Care (Signed)
Immediate Anesthesia Transfer of Care Note  Patient: Jason [name]  Procedure(s) Performed: Procedure(s) (LRB) with comments: MICROLARYNGOSCOPY (N/A) - Micro Direct Laryngoscopy with Vocal Cord Stripping  Patient Location: PACU  Anesthesia Type:General  Level of Consciousness: awake, alert  and oriented  Airway & Oxygen Therapy: Patient Spontanous Breathing and Patient connected to face mask oxygen  Post-op Assessment: Report given to PACU RN  Post vital signs: Reviewed and stable  Complications: No apparent anesthesia complications

## 2012-03-18 NOTE — Progress Notes (Signed)
Faxed sleep assessment to Dr. Zebedee Iba office today.

## 2012-03-18 NOTE — Op Note (Signed)
03/18/2012  11:57 AM    Lumpkin, Rico Ala  [id number]   Pre-Op Dx:  Right glottic papilloma   Post-op Dx: Same  Proc: MicroDirect laryngoscopy with laser excision, glottic papilloma   Surg:  Flo Shanks T MD  Anes:  GOT  EBL:  Minimal  Comp:  None  Findings:  Pedunculated large mass centered on the right vocal cord approaching the  Anterior commissure.  Involvement now includes the anterior free edge and onto the superior surface of the cord.  Procedure: With the patient in a comfortable supine position, general orotracheal anesthesia was induced. At an appropriate level, the table was turned 90 the patient placed in a slight reverse Trendelenburg. A moist 4 x 4 was used to protect the upper alveolus. The anterior commissure laryngoscope was introduced and passed down into the supraglottis. The lesion was visualized. Some mild bleeding from intubation was controlled with 4% cocaine solution.  Photographs Were taken using a 0 Hopkins rod scope.  The laryngoscope was suspended in the standard fashion.  Laser excision was elected. The laser had been previously tested for aim and function.  The patient was protected with saline saturated eye pads and saline saturated towels. All personnel in the room had protective eye gear.  A cotton pledget moistened with 4% cocaine solution was applied over the endotracheal tube for protection.  Using the laser in a single pulse, 10 W, 100 ms mode,  the mucosa on the superior surface of the vocal cord was incised with a small margin beyond the visible lesion. The incision went up to the anterior commissure. With the tumor mass distracted to the left, the laser was used to carry the dissection into the muscularis of the vocal cord. This was carried downward. The laser was changed to a 200 ms single pulse mode. Finally, the attachment was traversed anteriorly and the dissection was carried posteriorly and finally the lesion was removed in its entirety.  The pedicle was roughly 10 x 10 mm. The lesion was passed off the field for permanent pathologic interpretation.    Another 4% cocaine saturated pledget was placed against the raw surface or postoperative anesthesia cough suppression. This was left in place for several minutes. Upon removal, the 0 Hopkins rod was used once again. Complete excision was felt to have been accomplished. Photographs were taken.  The laryngoscope was taken out of suspension and removed without difficulty. The upper alveolus was intact.  Patient was returned to anesthesia, awakened, extubated, and transferred to recovery in stable condition.  Dispo:   PACU to home  Plan:  Ice, elevation, hydrocodone for pain relief and also cough suppression. Advancement of diet and activity. Recheck my office 2-3 weeks.  Cephus Richer MD

## 2012-03-18 NOTE — Interval H&P Note (Signed)
History and Physical Interval Note:  03/18/2012 8:57 AM  Jason [name]  has presented today for surgery, with the diagnosis of right laryngial mass  The various methods of treatment have been discussed with the patient and family. After consideration of risks, benefits and other options for treatment, the patient has consented to  Procedure(s) (LRB) with comments: MICROLARYNGOSCOPY (N/A) - Micro Direct Laryngoscopy with Vocal Cord Stripping as a surgical intervention .  The patient's history has been re-reviewed, patient re-examined, no change in status, stable for surgery.  I have re-reviewed the patient's chart and labs.  Questions were answered to the patient's satisfaction.     Flo Shanks

## 2012-03-18 NOTE — Anesthesia Postprocedure Evaluation (Signed)
Anesthesia Post Note  Patient: Jason [name]  Procedure(s) Performed: Procedure(s) (LRB): MICROLARYNGOSCOPY (N/A)  Anesthesia type: General  Patient location: PACU  Post pain: Pain level controlled  Post assessment: Post-op Vital signs reviewed  Last Vitals: BP 141/63  Pulse 51  Temp 36.6 C (Oral)  Resp 15  SpO2 95%  Post vital signs: Reviewed  Level of consciousness: sedated  Complications: No apparent anesthesia complications

## 2012-03-18 NOTE — Preoperative (Signed)
Beta Blockers   Reason not to administer Beta Blockers:Not Applicable 

## 2012-03-18 NOTE — H&P (View-Only) (Signed)
Jason [name],  Jason [name] 79 y.o., male [id number]     Chief Complaint: Laryngeal Papilloma with obstruction  HPI: 3 month postoperative check.  His voice was better for a couple of weeks, and then has been getting worse ever since.  No pain.  No breathing difficulty.  No aspiration.  He has occasional globus sensation and brings up a hunk of gelatinous mucus periodically.  He does not think he is having reflux.  The pathology from his surgery showed a papilloma.   He relates that after having the stress test as part of his preoperative evaluation last time, he developed pain and tenderness in a known varicose vein going across his LEFT medial posterior knee.  He was placed on anticoagulants and support hose.  This is feeling better.  I did speak with Dr.Ganji, his cardiologist, who reports there was no true deep vein thrombosis.    3 weeks recheck.  His throat feels basically unchanged.  Voice may be slightly more horse.  Swallowing without difficulty.  He occasionally will cough up a small piece of debris and a streak of blood.   He is back early because he is having tightness and pressure in his chest more than just breathing difficulty.  It reminds him of before he had his bypass surgery 20+ years ago.  2 days ago, he was unable to sleep lying down.  No obvious wheezing.  No recent upper respiratory infection.  I reviewed his cardiac evaluation per Dr. Ganji.  His heart condition is stable although tenuous.  I discussed his case with Dr. Crews.  He feels this patient would be better to either not have the surgery at all, or to have it done at [facility] if necessary.  I discussed this with the patient's partner Wanda today.  At 1st, he was considering canceling the operation altogether.  However, for the past few days, he is basically aphonic, and now starting to have some choking episodes where breathing is difficult.  He recognizes that there may be some  cardiac risk, although he tolerated this  very same operation in August without difficulty.  We will proceed in the immediate future.  I will complete his preop evaluation in the hospital.  PMH: Past Medical History  Diagnosis Date  . Hypertension   . Shortness of breath   . Diabetes mellitus   . GERD (gastroesophageal reflux disease)   . Myocardial infarction     1990  . Arthritis     R elbow- ? bursitis in R knee, bulging disc- lumbar, congenital fusion of lumbar 5-S1?  . Hoarseness of voice     vocal chord growth  . Chronic kidney disease     CKD Stage III, as of 08/06/11  . Coronary artery disease   . Headache     pt. reports that its related to BP med., MD is aware   . Liver disease     pt. unsure but states he has seen this on a prior med. record   . AAA (abdominal aortic aneurysm)     3.1 cm, infrarenal by U/S 08/01/09  . Blood clot in vein     pt. reports that he has / had blood clot in L leg - 01/2012    Surg Hx: Past Surgical History  Procedure Date  . Coronary artery bypass graft     1990  . Cholecystectomy     open procedure   . Microlaryngoscopy 10/06/2011    Procedure: MICROLARYNGOSCOPY;  Surgeon: Perry Brucato   Megann Easterwood, MD;  Location: [facility] OR;  Service: ENT;  Laterality: N/A;  MICRO DIRECT LARYNGOSCOPY BIOPSY, FROZEN SECTION, with LASER ABLATION  . Eye surgery     blepheroplasty - bilateral- 2012, has also had tear duct  probing    FHx:  No family history on file. SocHx:  reports that he has quit smoking. He quit smokeless tobacco use about 23 years ago. He reports that he does not drink alcohol or use illicit drugs.  ALLERGIES:  Allergies  Allergen Reactions  . Januvia (Sitagliptin) Rash     (Not in a hospital admission)  Results for orders placed during the hospital encounter of 03/17/12 (from the past 48 hour(s))  BASIC METABOLIC PANEL     Status: Abnormal   Collection Time   03/17/12  9:21 AM      Component Value Range Comment   Sodium 139  135 - 145 mEq/L    Potassium 3.9  3.5 - 5.1 mEq/L     Chloride 103  96 - 112 mEq/L    CO2 26  19 - 32 mEq/L    Glucose, Bld 171 (*) 70 - 99 mg/dL    BUN 11  6 - 23 mg/dL    Creatinine, Ser 1.17  0.50 - 1.35 mg/dL    Calcium 9.6  8.4 - 10.5 mg/dL    GFR calc non Af Amer 57 (*) >90 mL/min    GFR calc Af Amer 66 (*) >90 mL/min   CBC     Status: Abnormal   Collection Time   03/17/12  9:21 AM      Component Value Range Comment   WBC 6.8  4.0 - 10.5 K/uL    RBC 3.94 (*) 4.22 - 5.81 MIL/uL    Hemoglobin 10.9 (*) 13.0 - 17.0 g/dL    HCT 33.3 (*) 39.0 - 52.0 %    MCV 84.5  78.0 - 100.0 fL    MCH 27.7  26.0 - 34.0 pg    MCHC 32.7  30.0 - 36.0 g/dL    RDW 16.0 (*) 11.5 - 15.5 %    Platelets 175  150 - 400 K/uL    No results found.   There were no vitals taken for this visit.  PHYSICAL EXAM: He is tall and trim but appears slightly anxious.  The voice is a loud whisper but basically non phonatory.  Anterior nose is clear.  Oral cavity is clear.  Neck without adenopathy.   Lungs are clear to auscultation.   Using the flexible laryngoscope, there remains a  8 mm pedunculated apparent papilloma on the undersurface of the anterior RIGHT true vocal cord.  The airway is overall quite good.  Vocal cords are fully mobile.  No pooling in valleculae or pyriforms.    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  We have cleared him for anesthesia with his cardiologist, Dr. Ganji.  We will proceed with removal, either with cold technique or with the laser.  He may be OK for same day discharge if OK per Anesthesia.  He will need a prescription for liquid Vicodin for pain relief and also cough suppression.    Librada Castronovo 03/17/2012, 6:46 PM      

## 2012-03-21 ENCOUNTER — Encounter (HOSPITAL_COMMUNITY): Payer: Self-pay | Admitting: Otolaryngology

## 2013-01-31 ENCOUNTER — Other Ambulatory Visit: Payer: Self-pay | Admitting: Internal Medicine

## 2013-01-31 DIAGNOSIS — I714 Abdominal aortic aneurysm, without rupture: Secondary | ICD-10-CM

## 2013-02-17 ENCOUNTER — Ambulatory Visit
Admission: RE | Admit: 2013-02-17 | Discharge: 2013-02-17 | Disposition: A | Discharge status: Home / Self Care | Payer: Medicare Other | Source: Ambulatory Visit | Attending: Internal Medicine | Admitting: Internal Medicine

## 2013-02-17 DIAGNOSIS — I714 Abdominal aortic aneurysm, without rupture, unspecified: Secondary | ICD-10-CM

## 2013-05-10 DEATH — deceased

## 2013-11-18 IMAGING — CT CT NECK W/ CM
4 of 5 series · 17 of 33 positions shown, 19 images · IV contrast (75CC OMNI 300)
Comparison: None.

CLINICAL DATA: 81-year-old male with subglottic papilloma
identified by endoscopy on the right.  Hoarseness times 5 months.

CT NECK WITH CONTRAST
TECHNIQUE: Multidetector CT imaging of the neck was performed with
intravenous contrast.
Contrast: 75mL OMNIPAQUE IOHEXOL 300 MG/ML  SOLN

[Series 2: axial neck · axial · 0.38mm/px · z∈[+52,+219]mm · 5 of 101 slices shown]
[im 17/101  bone]
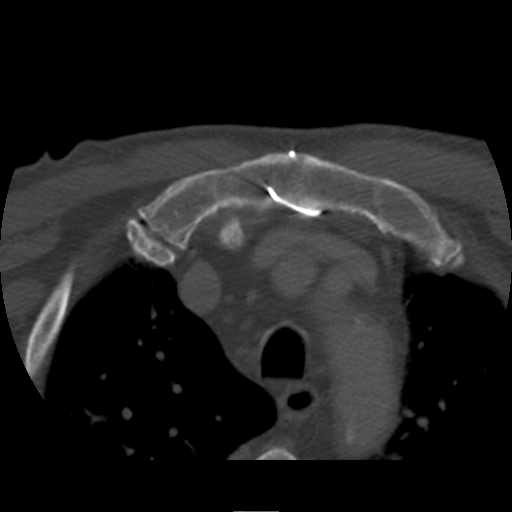
[im 34/101  bone]
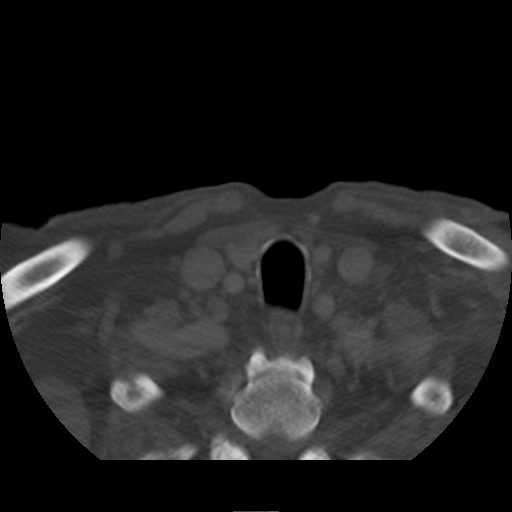
[im 51/101  bone]
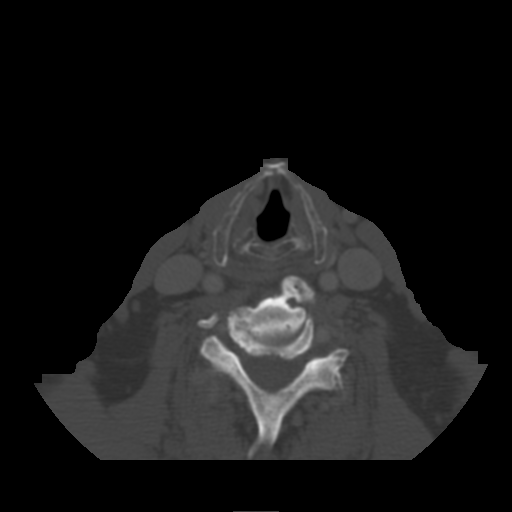
[im 67/101  bone]
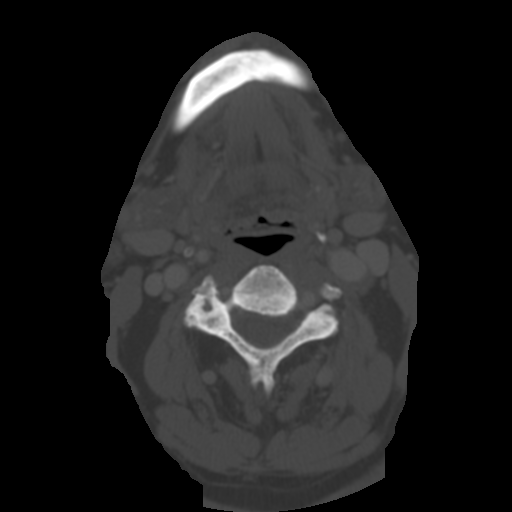
[im 84/101  bone]
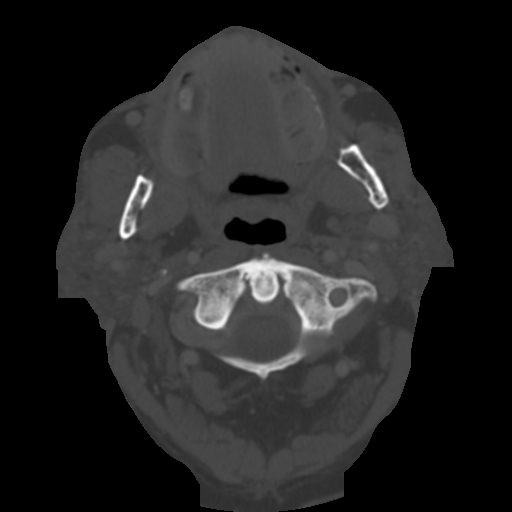

[Series 400: coronal · coronal · 0.50mm/px · 3 of 78 slices shown]
[im 16/78  bone]
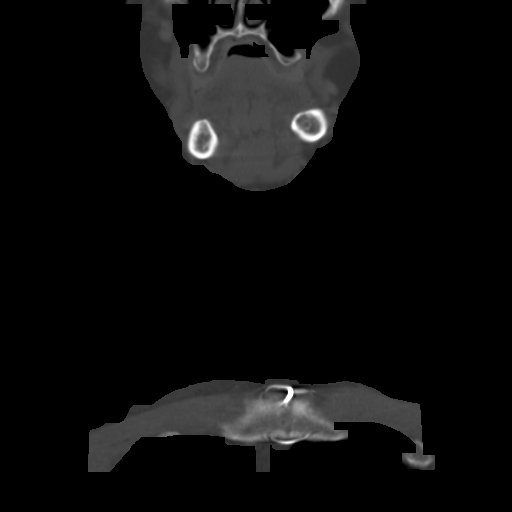
[im 31/78  bone]
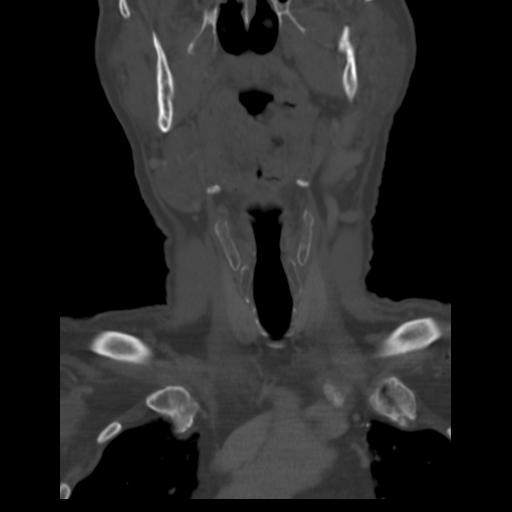
[im 47/78  bone]
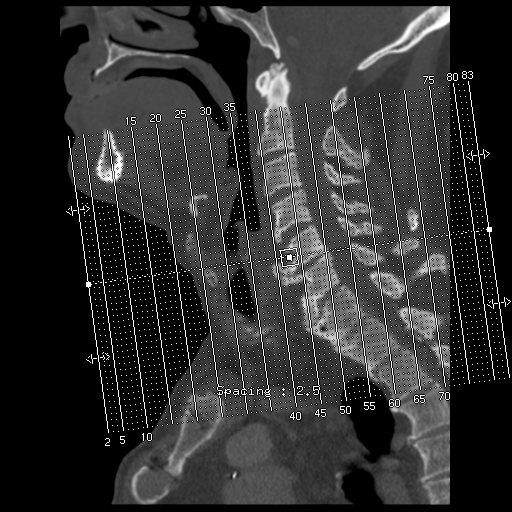

[Series 401: sagittal · sagittal · 0.50mm/px · 5 of 82 slices shown, 6 images]
[im 28/82  bone]
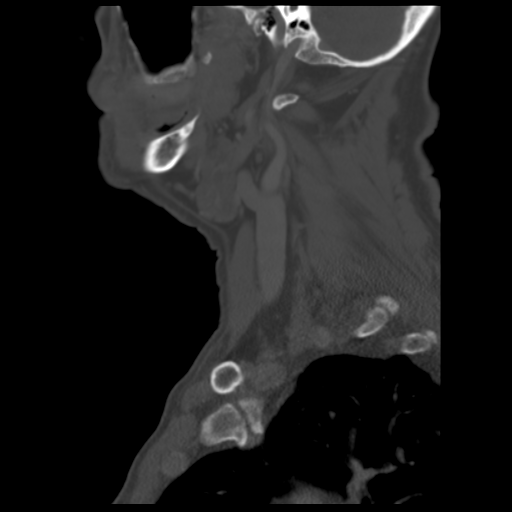
[im 34/82  bone]
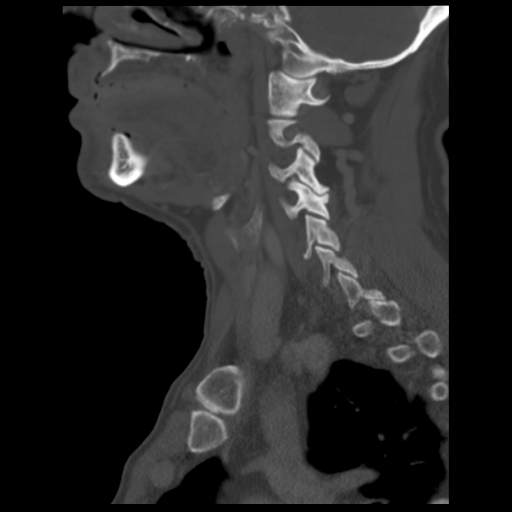
[im 41/82  soft-tissue]
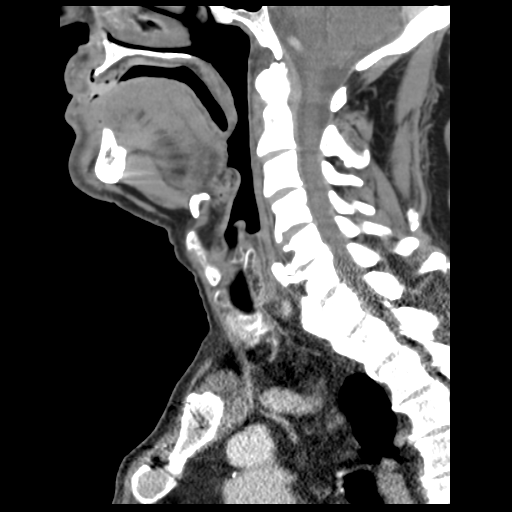
[im 41/82  bone]
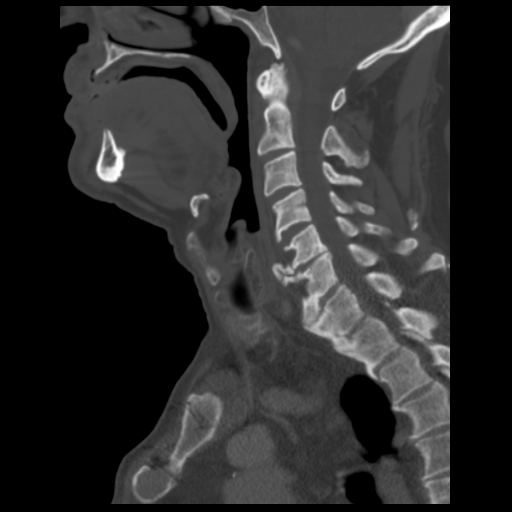
[im 48/82  bone]
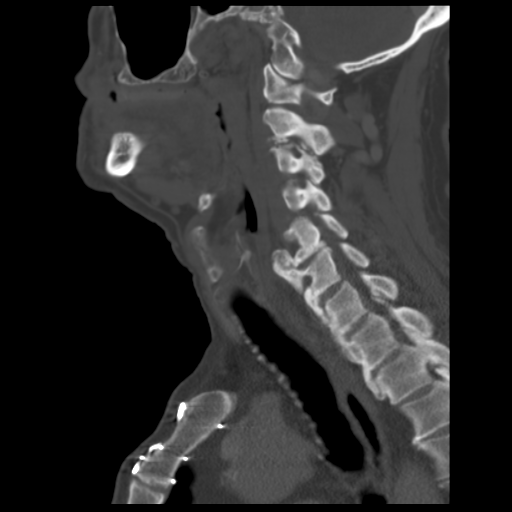
[im 55/82  bone]
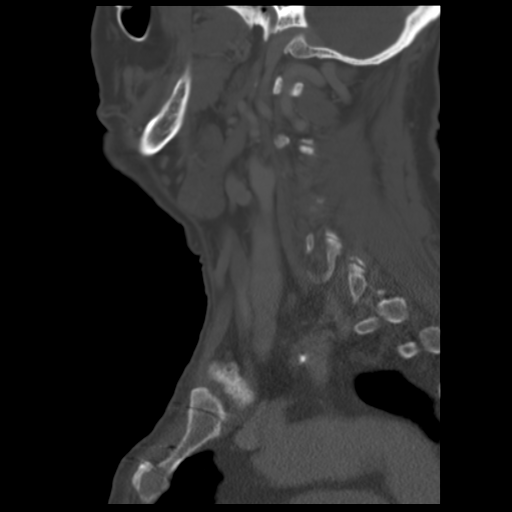

[Series 402: angled to hyoid · axial · 0.50mm/px · z∈[+34,+179]mm · 4 of 100 slices shown, 5 images]
[im 20/100  soft-tissue]
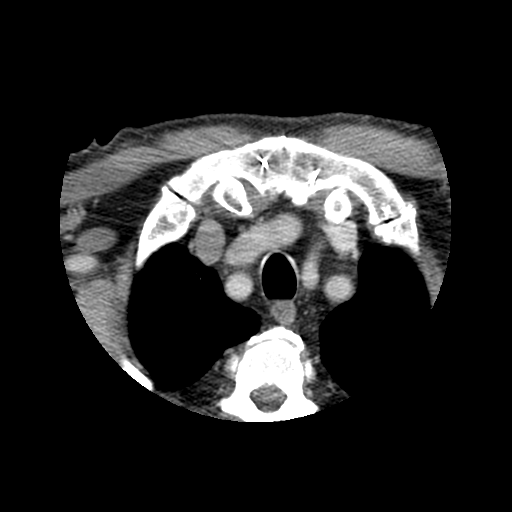
[im 20/100  bone]
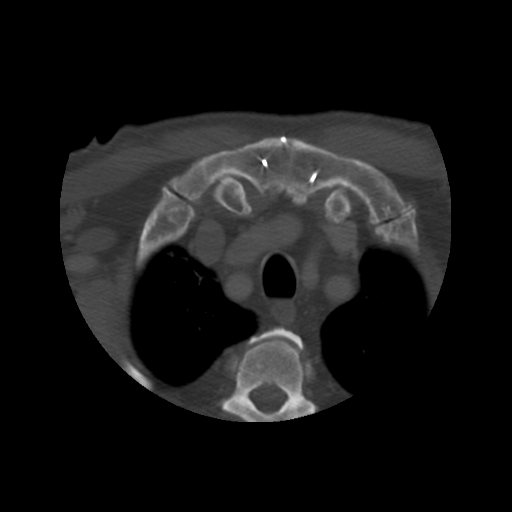
[im 40/100  bone]
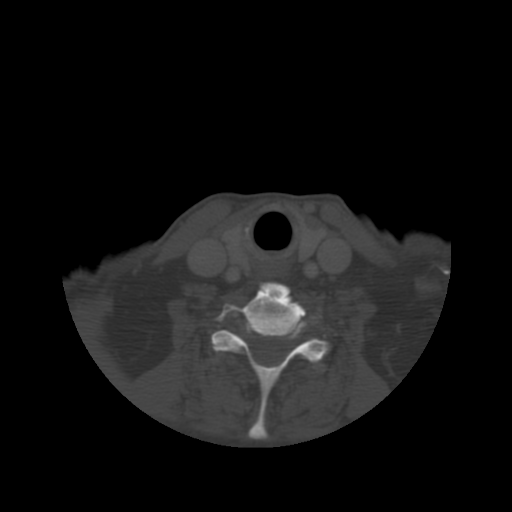
[im 60/100  bone]
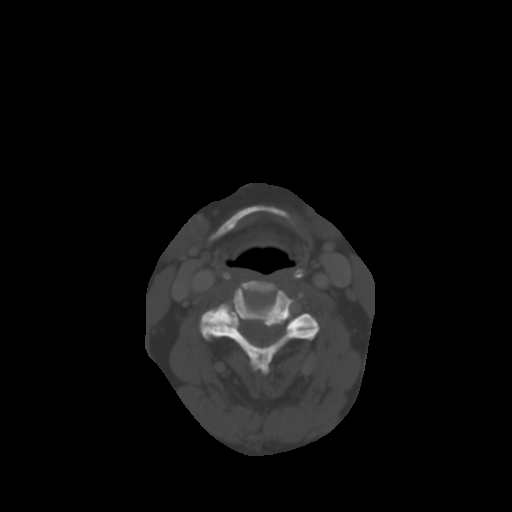
[im 80/100  bone]
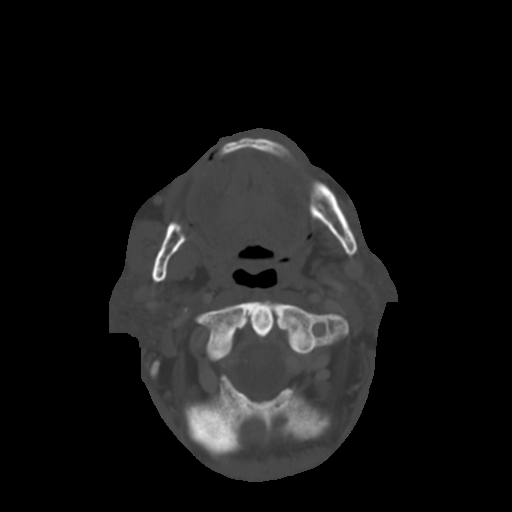

[17 of 33 positions shown; findings below may reference images not displayed]

FINDINGS: A small round hyperenhancing lesion at the anterior
larynx measuring 5 mm on series 2 image 54.  On image 55 this may
extend to the anterior commisure.

Adjacent structures of the larynx appear within normal limits.
Laryngeal cartilages and thyroid cartilage appears symmetric and
within normal limits.  Negative epiglottis.  No cervical
lymphadenopathy.

Negative thyroid.  Negative retropharyngeal, parapharyngeal and
sublingual spaces.  Submandibular and parotid glands are within
normal limits.  Pharynx mucosal contours are within normal limits.
Negative visualized superior mediastinum.

Major vascular structures are patent.  Soft atherosclerotic plaque
in the posterior left ICA origin and bulb.

Visualized paranasal sinuses and mastoids are clear.  The patient
is edentulous. No acute osseous abnormality identified.  Bulky
cervical spine endplate osteophytes.
Sequelae median sternotomy.  Negative visualized lungs except for
scarring or atelectasis.
IMPRESSION: 1.  5 mm hyperenhancing soft tissue polyp/lesion situated
anteriorly in the larynx on the right. Series 2 image 54 and 55.
It appears inseparable from the anterior commisure.
2.  No other laryngeal abnormality.  No cervical lymphadenopathy or
other acute findings.
3.  Left ICA origin atherosclerosis.

## 2013-12-23 IMAGING — CR DG CHEST 2V
2 series · 2 of 2 positions shown · non-contrast
Comparison: None.

CLINICAL DATA: Preop

CHEST - 2 VIEW

[view not recorded (1 of 2)]
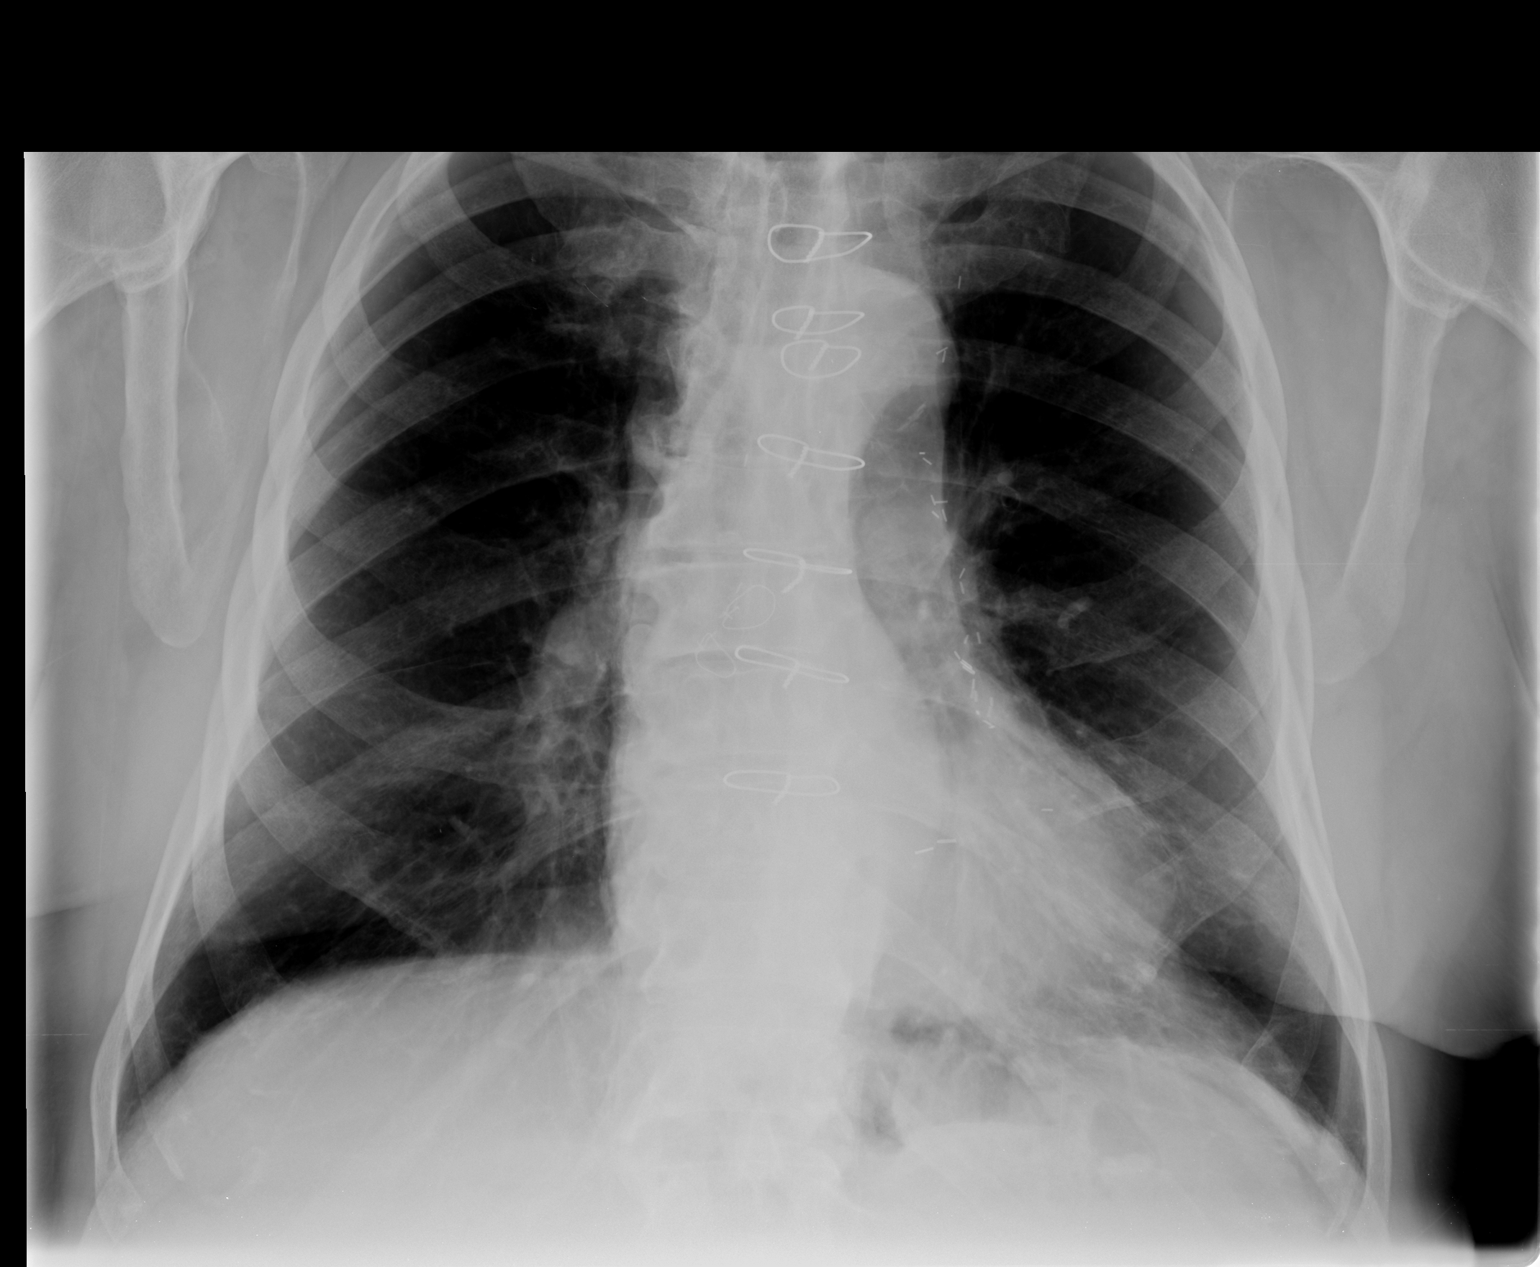

[view not recorded (2 of 2)]
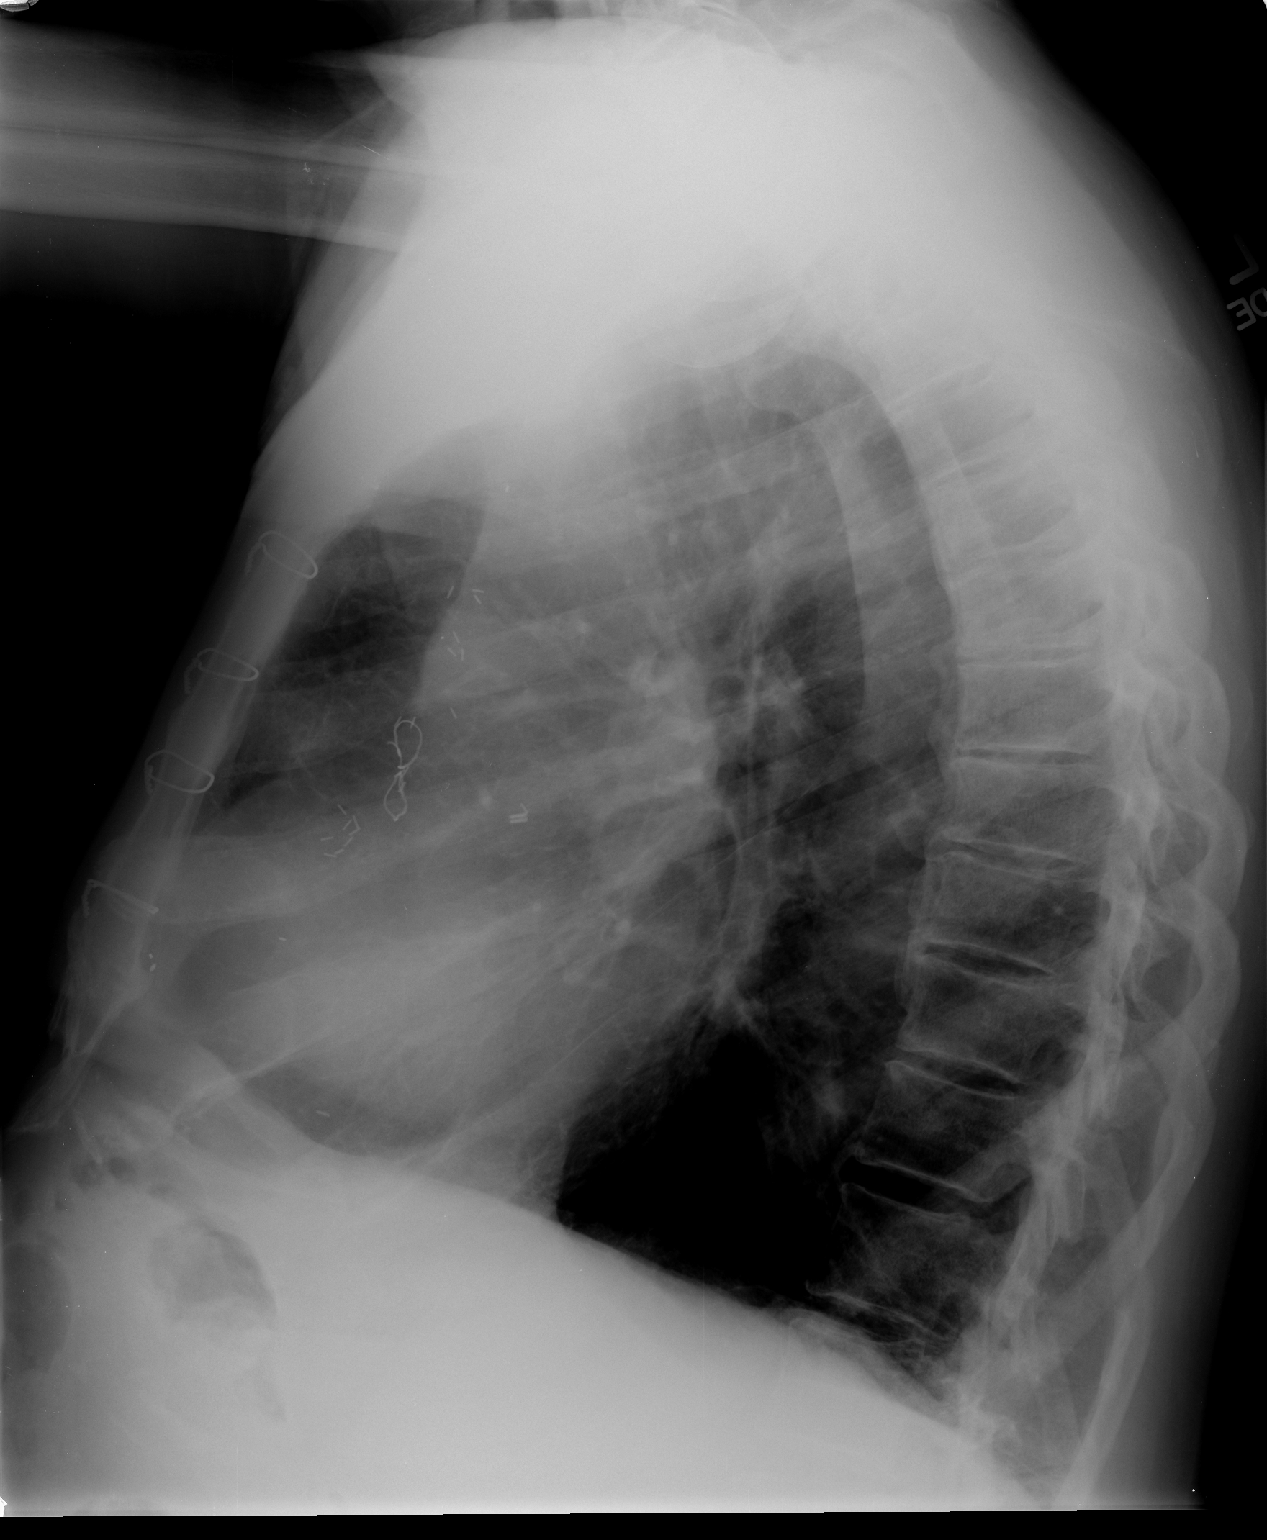

[2 of 2 positions shown; findings below may reference images not displayed]

FINDINGS: Cardiomediastinal silhouette is unremarkable.  The
patient is status post CABG.  Tortuous thoracic aorta.  Mild
degenerative changes thoracic spine.  No acute infiltrate or
pulmonary edema.
IMPRESSION: Status post CABG.  No active disease.

## 2014-01-18 ENCOUNTER — Encounter (HOSPITAL_COMMUNITY): Payer: Self-pay | Admitting: Cardiology

## 2015-05-20 IMAGING — US US AORTA
1 series · 14 of 20 positions shown · non-contrast
Comparison: 08/01/2009

CLINICAL DATA: Aortic atherosclerosis and ectasia

EXAM:
ULTRASOUND OF ABDOMINAL AORTA
TECHNIQUE: Ultrasound examination of the abdominal aorta was performed to
evaluate for abdominal aortic aneurysm.

[Series 1: us aorta · 0.39mm/px · 14 of 20 slices shown]
[im 1/20]
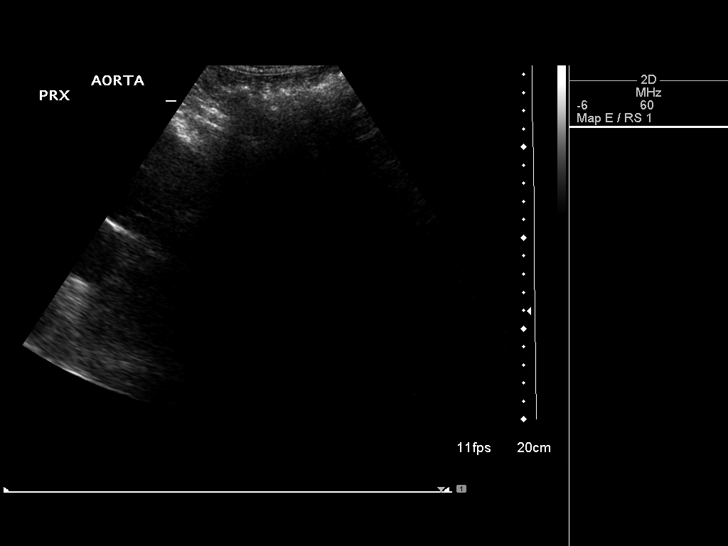
[im 3/20]
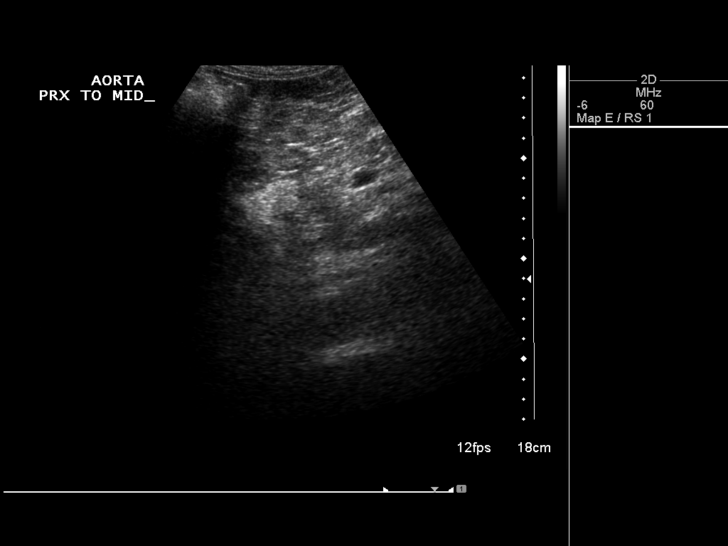
[im 4/20]
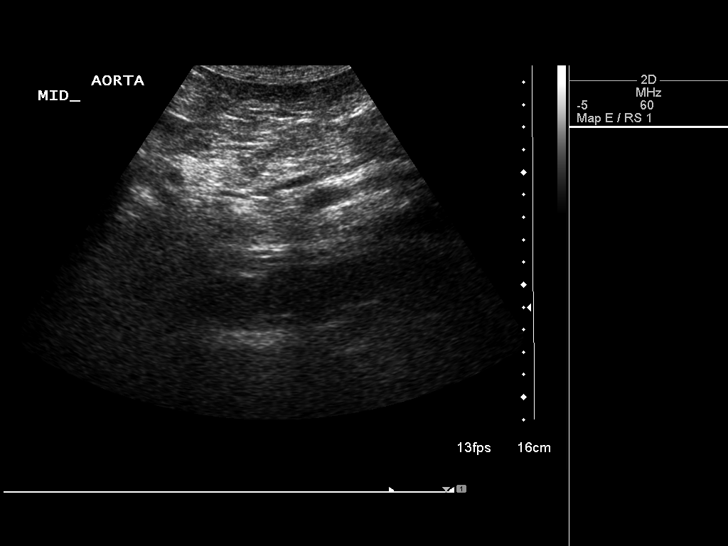
[im 6/20]
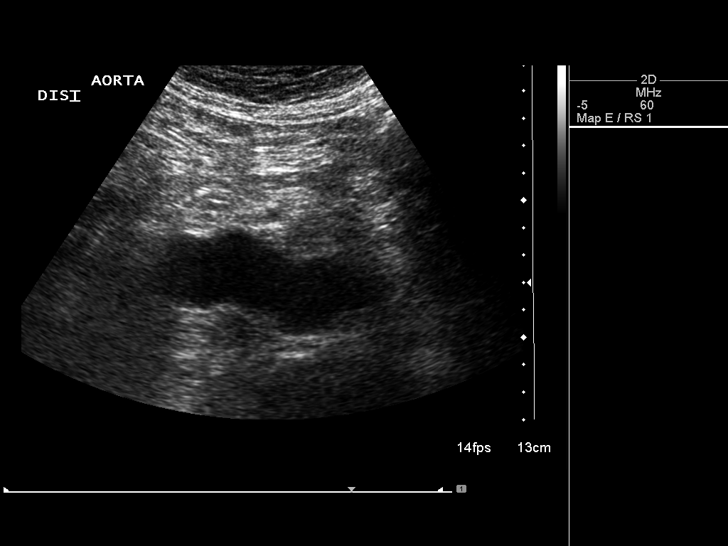
[im 7/20]
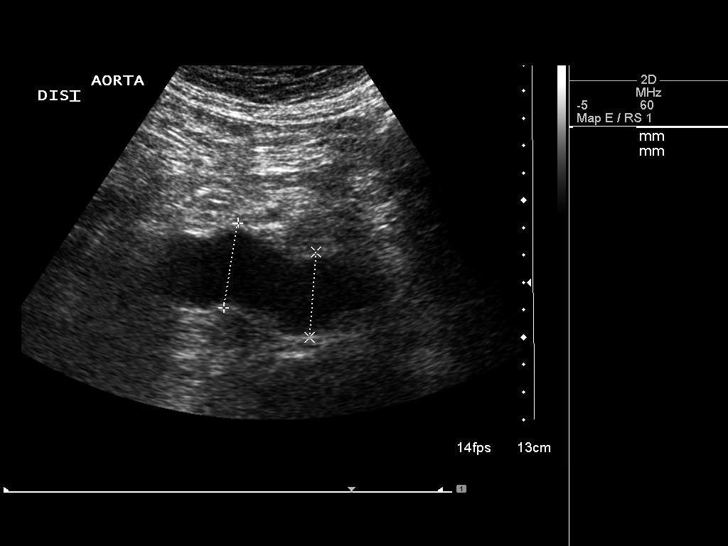
[im 8/20]
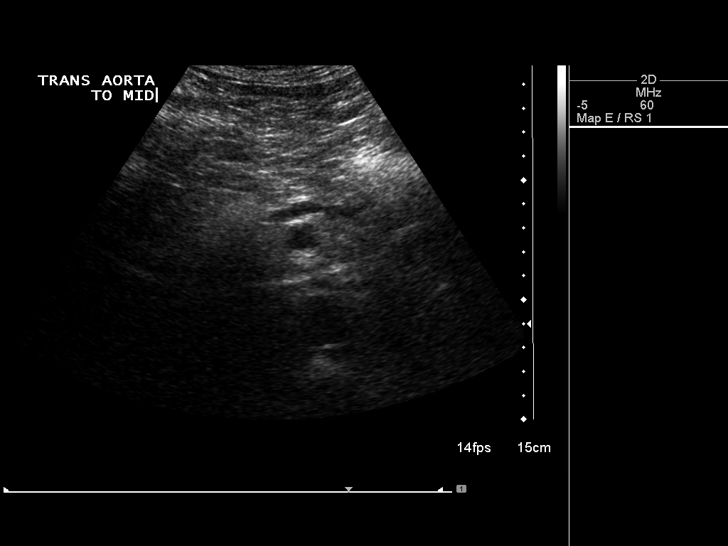
[im 10/20]
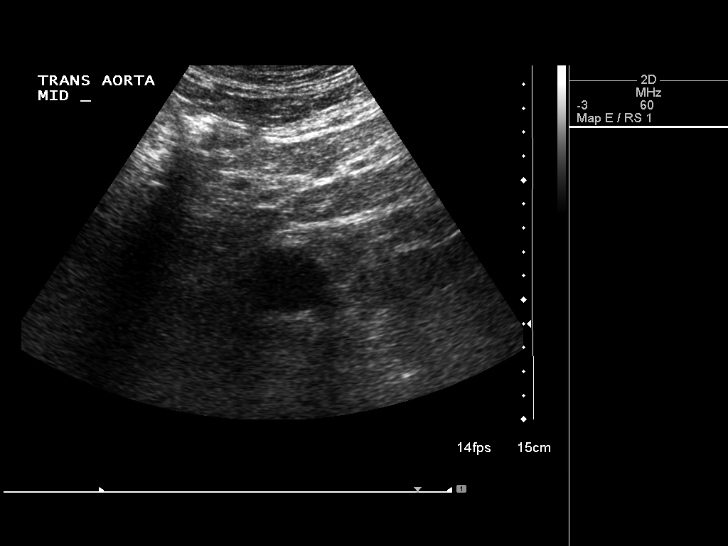
[im 11/20]
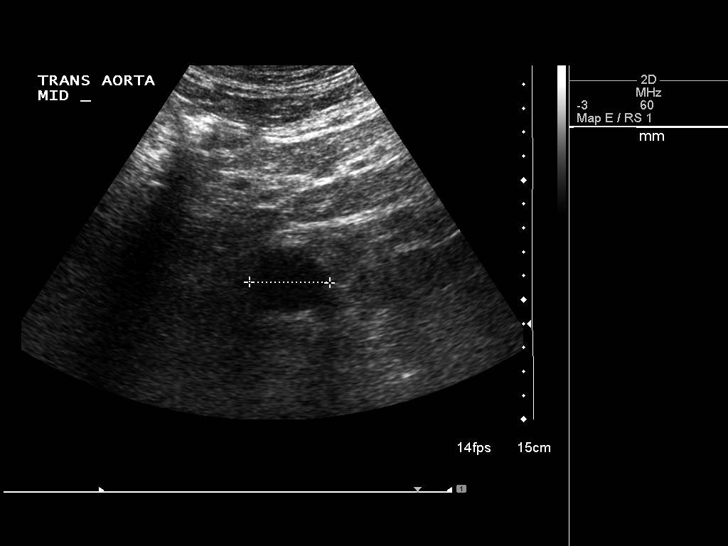
[im 13/20]
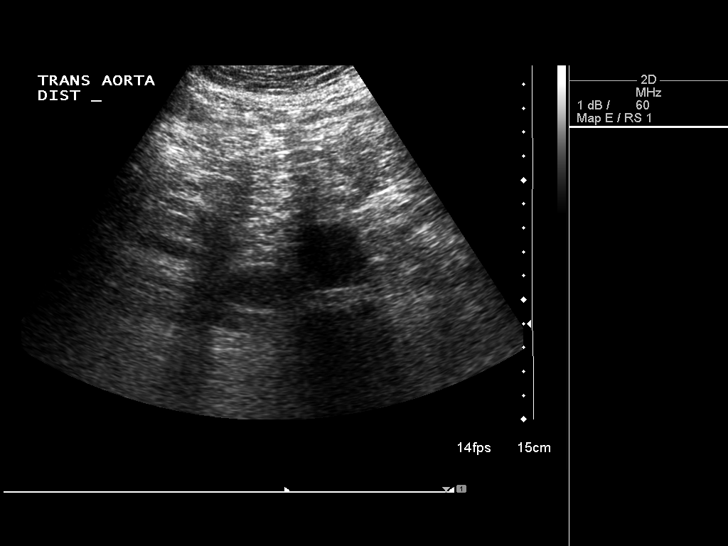
[im 14/20]
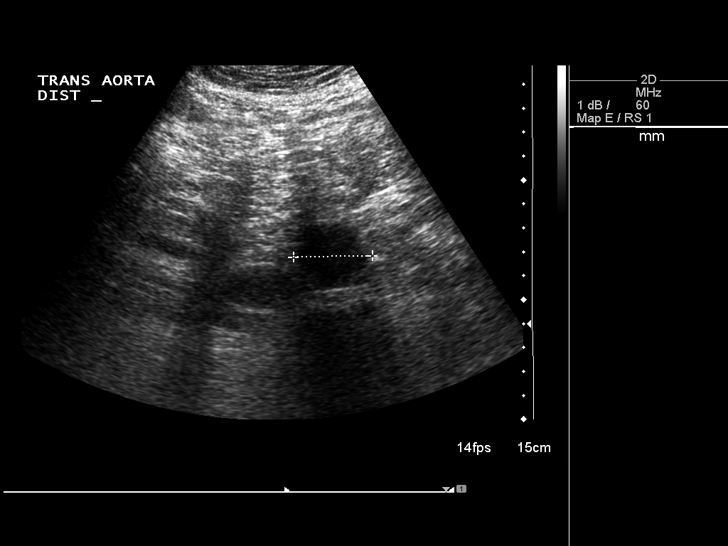
[im 16/20]
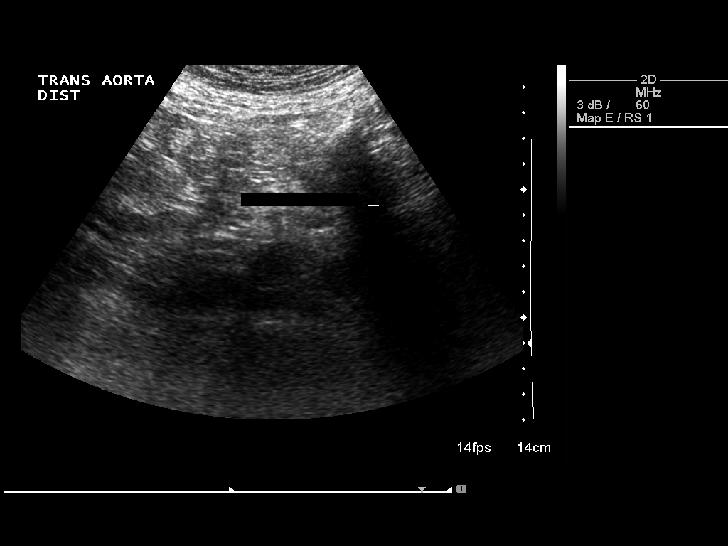
[im 17/20]
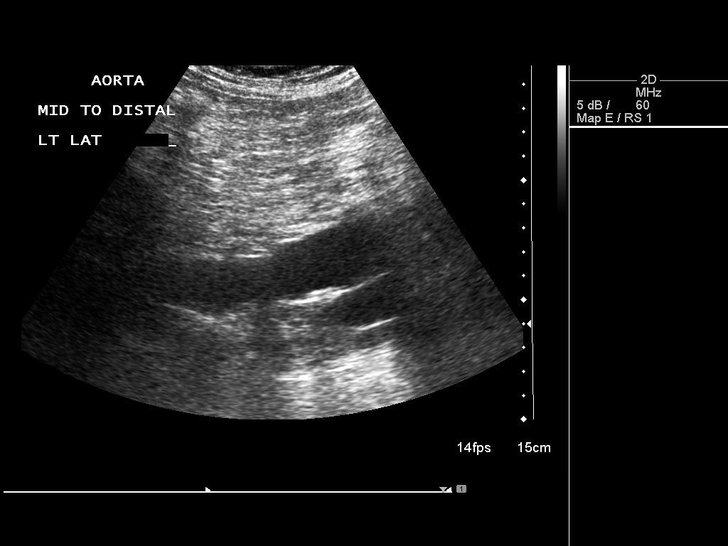
[im 18/20]
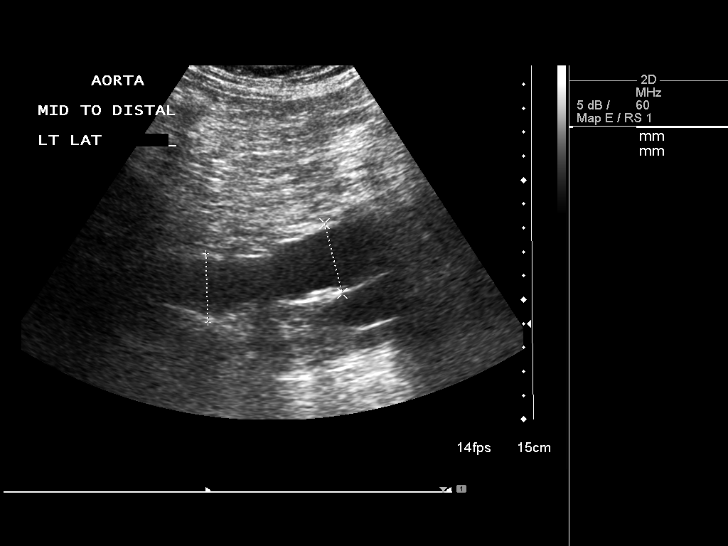
[im 20/20]
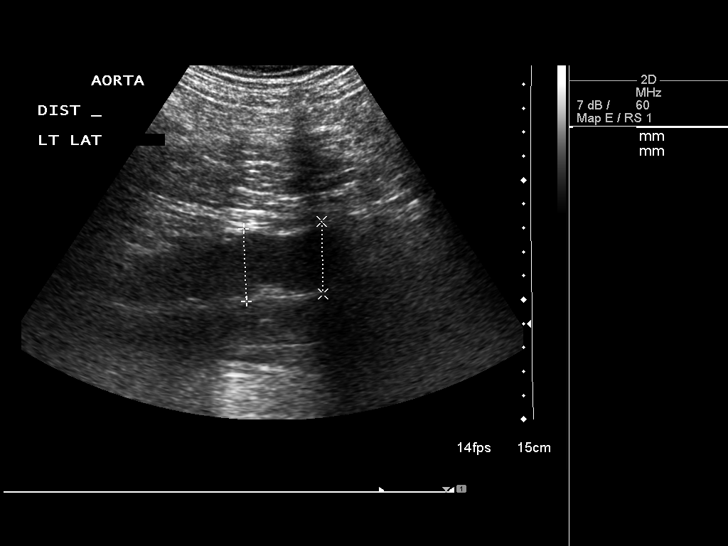

[14 of 20 positions shown; findings below may reference images not displayed]

FINDINGS: Abdominal Aorta

Mild aortic ectasia and atherosclerotic change as before. No
interval change.

Maximum AP

Diameter:  3.2 cm

Maximum TRV

Diameter: 3.3 cm
IMPRESSION: Stable infrarenal aortic ectasia, maximal diameter 3.3 cm.
